# Patient Record
Sex: Female | Born: 1975 | Race: Black or African American | Hispanic: No | Marital: Single | Smoking: Never smoker
Health system: Southern US, Community
[De-identification: ages and names within clinical notes are randomized; demographics above are authoritative.]

## PROBLEM LIST (undated history)

## (undated) DIAGNOSIS — D649 Anemia, unspecified: Secondary | ICD-10-CM

## (undated) DIAGNOSIS — I10 Essential (primary) hypertension: Secondary | ICD-10-CM

## (undated) HISTORY — PX: OTHER SURGICAL HISTORY: SHX169

---

## 1997-03-02 ENCOUNTER — Encounter (HOSPITAL_COMMUNITY): Admission: RE | Admit: 1997-03-02 | Discharge: 1997-05-31 | Payer: Self-pay | Admitting: Obstetrics & Gynecology

## 1997-06-30 ENCOUNTER — Encounter (HOSPITAL_COMMUNITY): Admission: RE | Admit: 1997-06-30 | Discharge: 1997-09-28 | Payer: Self-pay | Admitting: *Deleted

## 1998-05-13 ENCOUNTER — Ambulatory Visit (HOSPITAL_COMMUNITY): Admission: RE | Admit: 1998-05-13 | Discharge: 1998-05-13 | Payer: Self-pay | Admitting: Obstetrics & Gynecology

## 1998-05-27 ENCOUNTER — Encounter: Admission: RE | Admit: 1998-05-27 | Discharge: 1998-05-27 | Payer: Self-pay | Admitting: Obstetrics

## 1998-06-09 ENCOUNTER — Ambulatory Visit (HOSPITAL_COMMUNITY): Admission: RE | Admit: 1998-06-09 | Discharge: 1998-06-09 | Payer: Self-pay | Admitting: Obstetrics & Gynecology

## 1998-07-01 ENCOUNTER — Ambulatory Visit (HOSPITAL_COMMUNITY): Admission: RE | Admit: 1998-07-01 | Discharge: 1998-07-01 | Payer: Self-pay | Admitting: Obstetrics

## 1998-07-14 ENCOUNTER — Encounter: Admission: RE | Admit: 1998-07-14 | Discharge: 1998-07-14 | Payer: Self-pay | Admitting: Obstetrics & Gynecology

## 1998-07-21 ENCOUNTER — Encounter: Admission: RE | Admit: 1998-07-21 | Discharge: 1998-07-21 | Payer: Self-pay | Admitting: Obstetrics & Gynecology

## 1998-07-28 ENCOUNTER — Encounter: Admission: RE | Admit: 1998-07-28 | Discharge: 1998-07-28 | Payer: Self-pay | Admitting: Obstetrics & Gynecology

## 1998-07-29 ENCOUNTER — Ambulatory Visit (HOSPITAL_COMMUNITY): Admission: RE | Admit: 1998-07-29 | Discharge: 1998-07-29 | Payer: Self-pay | Admitting: Obstetrics

## 1998-08-11 ENCOUNTER — Encounter: Admission: RE | Admit: 1998-08-11 | Discharge: 1998-08-11 | Payer: Self-pay | Admitting: Obstetrics & Gynecology

## 1998-08-18 ENCOUNTER — Encounter: Admission: RE | Admit: 1998-08-18 | Discharge: 1998-08-18 | Payer: Self-pay | Admitting: Obstetrics & Gynecology

## 1998-08-19 ENCOUNTER — Ambulatory Visit (HOSPITAL_COMMUNITY): Admission: RE | Admit: 1998-08-19 | Discharge: 1998-08-19 | Payer: Self-pay | Admitting: Obstetrics

## 1998-08-26 ENCOUNTER — Ambulatory Visit (HOSPITAL_COMMUNITY): Admission: RE | Admit: 1998-08-26 | Discharge: 1998-08-26 | Payer: Self-pay | Admitting: Obstetrics & Gynecology

## 1998-09-02 ENCOUNTER — Encounter: Payer: Self-pay | Admitting: Obstetrics & Gynecology

## 1998-09-02 ENCOUNTER — Inpatient Hospital Stay (HOSPITAL_COMMUNITY): Admission: AD | Admit: 1998-09-02 | Discharge: 1998-09-02 | Payer: Self-pay | Admitting: *Deleted

## 1998-09-08 ENCOUNTER — Encounter: Admission: RE | Admit: 1998-09-08 | Discharge: 1998-09-08 | Payer: Self-pay | Admitting: Obstetrics & Gynecology

## 1998-09-09 ENCOUNTER — Ambulatory Visit (HOSPITAL_COMMUNITY): Admission: RE | Admit: 1998-09-09 | Discharge: 1998-09-09 | Payer: Self-pay | Admitting: Obstetrics

## 1998-09-15 ENCOUNTER — Encounter: Admission: RE | Admit: 1998-09-15 | Discharge: 1998-09-15 | Payer: Self-pay | Admitting: Obstetrics & Gynecology

## 1998-09-22 ENCOUNTER — Encounter: Admission: RE | Admit: 1998-09-22 | Discharge: 1998-09-22 | Payer: Self-pay | Admitting: Obstetrics & Gynecology

## 1998-09-23 ENCOUNTER — Encounter (HOSPITAL_COMMUNITY): Admission: RE | Admit: 1998-09-23 | Discharge: 1998-10-04 | Payer: Self-pay | Admitting: *Deleted

## 1998-09-29 ENCOUNTER — Encounter: Admission: RE | Admit: 1998-09-29 | Discharge: 1998-09-29 | Payer: Self-pay | Admitting: Obstetrics & Gynecology

## 1998-09-30 ENCOUNTER — Encounter (INDEPENDENT_AMBULATORY_CARE_PROVIDER_SITE_OTHER): Payer: Self-pay | Admitting: Specialist

## 1998-09-30 ENCOUNTER — Inpatient Hospital Stay (HOSPITAL_COMMUNITY): Admission: AD | Admit: 1998-09-30 | Discharge: 1998-10-08 | Payer: Self-pay | Admitting: Obstetrics & Gynecology

## 1998-09-30 ENCOUNTER — Ambulatory Visit (HOSPITAL_COMMUNITY): Admission: RE | Admit: 1998-09-30 | Discharge: 1998-09-30 | Payer: Self-pay | Admitting: Obstetrics

## 1998-10-05 ENCOUNTER — Encounter (HOSPITAL_COMMUNITY): Admission: RE | Admit: 1998-10-05 | Discharge: 1999-01-03 | Payer: Self-pay | Admitting: *Deleted

## 1998-10-06 ENCOUNTER — Encounter: Admission: RE | Admit: 1998-10-06 | Discharge: 1998-10-06 | Payer: Self-pay | Admitting: Obstetrics & Gynecology

## 1998-10-28 ENCOUNTER — Other Ambulatory Visit: Admission: RE | Admit: 1998-10-28 | Discharge: 1998-10-28 | Payer: Self-pay | Admitting: *Deleted

## 1998-11-17 ENCOUNTER — Ambulatory Visit (HOSPITAL_COMMUNITY): Admission: RE | Admit: 1998-11-17 | Discharge: 1998-11-17 | Payer: Self-pay | Admitting: *Deleted

## 1998-11-18 ENCOUNTER — Encounter: Admission: RE | Admit: 1998-11-18 | Discharge: 1998-11-18 | Payer: Self-pay | Admitting: Family Medicine

## 1998-12-20 ENCOUNTER — Encounter: Admission: RE | Admit: 1998-12-20 | Discharge: 1998-12-20 | Payer: Self-pay | Admitting: Family Medicine

## 1999-01-07 ENCOUNTER — Encounter (HOSPITAL_COMMUNITY): Admission: RE | Admit: 1999-01-07 | Discharge: 1999-04-07 | Payer: Self-pay | Admitting: *Deleted

## 1999-02-02 ENCOUNTER — Ambulatory Visit (HOSPITAL_COMMUNITY): Admission: RE | Admit: 1999-02-02 | Discharge: 1999-02-02 | Payer: Self-pay | Admitting: *Deleted

## 1999-02-02 ENCOUNTER — Encounter (INDEPENDENT_AMBULATORY_CARE_PROVIDER_SITE_OTHER): Payer: Self-pay

## 1999-05-08 ENCOUNTER — Encounter: Admission: RE | Admit: 1999-05-08 | Discharge: 1999-06-07 | Payer: Self-pay | Admitting: *Deleted

## 1999-07-29 ENCOUNTER — Encounter: Admission: RE | Admit: 1999-07-29 | Discharge: 1999-07-29 | Payer: Self-pay | Admitting: Family Medicine

## 1999-10-13 ENCOUNTER — Encounter: Admission: RE | Admit: 1999-10-13 | Discharge: 1999-10-13 | Payer: Self-pay | Admitting: Family Medicine

## 1999-10-13 ENCOUNTER — Other Ambulatory Visit: Admission: RE | Admit: 1999-10-13 | Discharge: 1999-10-13 | Payer: Self-pay | Admitting: Family Medicine

## 2000-06-27 ENCOUNTER — Encounter: Admission: RE | Admit: 2000-06-27 | Discharge: 2000-06-27 | Payer: Self-pay | Admitting: Family Medicine

## 2000-11-29 ENCOUNTER — Other Ambulatory Visit: Admission: RE | Admit: 2000-11-29 | Discharge: 2000-11-29 | Payer: Self-pay | Admitting: Family Medicine

## 2000-11-29 ENCOUNTER — Encounter: Admission: RE | Admit: 2000-11-29 | Discharge: 2000-11-29 | Payer: Self-pay | Admitting: Family Medicine

## 2000-12-25 ENCOUNTER — Encounter: Admission: RE | Admit: 2000-12-25 | Discharge: 2000-12-25 | Payer: Self-pay | Admitting: Family Medicine

## 2001-02-15 ENCOUNTER — Encounter: Admission: RE | Admit: 2001-02-15 | Discharge: 2001-02-15 | Payer: Self-pay | Admitting: Family Medicine

## 2001-08-29 ENCOUNTER — Encounter: Admission: RE | Admit: 2001-08-29 | Discharge: 2001-08-29 | Payer: Self-pay | Admitting: Family Medicine

## 2001-11-29 ENCOUNTER — Encounter: Admission: RE | Admit: 2001-11-29 | Discharge: 2001-11-29 | Payer: Self-pay | Admitting: Family Medicine

## 2001-11-29 ENCOUNTER — Other Ambulatory Visit: Admission: RE | Admit: 2001-11-29 | Discharge: 2001-11-29 | Payer: Self-pay | Admitting: Emergency Medicine

## 2002-01-05 ENCOUNTER — Emergency Department (HOSPITAL_COMMUNITY): Admission: EM | Admit: 2002-01-05 | Discharge: 2002-01-05 | Payer: Self-pay | Admitting: Internal Medicine

## 2002-03-26 ENCOUNTER — Encounter: Admission: RE | Admit: 2002-03-26 | Discharge: 2002-03-26 | Payer: Self-pay | Admitting: Family Medicine

## 2002-04-08 ENCOUNTER — Encounter: Admission: RE | Admit: 2002-04-08 | Discharge: 2002-04-08 | Payer: Self-pay | Admitting: Sports Medicine

## 2002-04-09 ENCOUNTER — Ambulatory Visit (HOSPITAL_COMMUNITY): Admission: RE | Admit: 2002-04-09 | Discharge: 2002-04-09 | Payer: Self-pay | Admitting: Family Medicine

## 2002-04-30 ENCOUNTER — Encounter: Admission: RE | Admit: 2002-04-30 | Discharge: 2002-04-30 | Payer: Self-pay | Admitting: Family Medicine

## 2002-05-07 ENCOUNTER — Encounter: Admission: RE | Admit: 2002-05-07 | Discharge: 2002-05-07 | Payer: Self-pay | Admitting: Family Medicine

## 2002-05-28 ENCOUNTER — Encounter: Admission: RE | Admit: 2002-05-28 | Discharge: 2002-05-28 | Payer: Self-pay | Admitting: Family Medicine

## 2002-06-17 ENCOUNTER — Encounter: Admission: RE | Admit: 2002-06-17 | Discharge: 2002-06-17 | Payer: Self-pay | Admitting: Family Medicine

## 2002-07-02 ENCOUNTER — Ambulatory Visit (HOSPITAL_COMMUNITY): Admission: RE | Admit: 2002-07-02 | Discharge: 2002-07-02 | Payer: Self-pay | Admitting: Family Medicine

## 2002-07-02 ENCOUNTER — Encounter: Payer: Self-pay | Admitting: Family Medicine

## 2002-07-15 ENCOUNTER — Inpatient Hospital Stay (HOSPITAL_COMMUNITY): Admission: RE | Admit: 2002-07-15 | Discharge: 2002-07-15 | Payer: Self-pay | Admitting: *Deleted

## 2002-07-18 ENCOUNTER — Encounter (INDEPENDENT_AMBULATORY_CARE_PROVIDER_SITE_OTHER): Payer: Self-pay | Admitting: Specialist

## 2002-07-18 ENCOUNTER — Ambulatory Visit (HOSPITAL_COMMUNITY): Admission: RE | Admit: 2002-07-18 | Discharge: 2002-07-18 | Payer: Self-pay | Admitting: Obstetrics and Gynecology

## 2002-07-21 ENCOUNTER — Encounter: Admission: RE | Admit: 2002-07-21 | Discharge: 2002-07-21 | Payer: Self-pay | Admitting: Family Medicine

## 2002-09-05 ENCOUNTER — Encounter: Admission: RE | Admit: 2002-09-05 | Discharge: 2002-09-05 | Payer: Self-pay | Admitting: Family Medicine

## 2002-10-31 ENCOUNTER — Encounter (INDEPENDENT_AMBULATORY_CARE_PROVIDER_SITE_OTHER): Payer: Self-pay | Admitting: *Deleted

## 2002-10-31 LAB — CONVERTED CEMR LAB

## 2002-11-01 ENCOUNTER — Emergency Department (HOSPITAL_COMMUNITY): Admission: EM | Admit: 2002-11-01 | Discharge: 2002-11-01 | Payer: Self-pay | Admitting: Emergency Medicine

## 2002-11-07 ENCOUNTER — Other Ambulatory Visit: Admission: RE | Admit: 2002-11-07 | Discharge: 2002-11-07 | Payer: Self-pay | Admitting: Family Medicine

## 2002-11-07 ENCOUNTER — Encounter: Admission: RE | Admit: 2002-11-07 | Discharge: 2002-11-07 | Payer: Self-pay | Admitting: Sports Medicine

## 2003-09-30 ENCOUNTER — Other Ambulatory Visit: Admission: RE | Admit: 2003-09-30 | Discharge: 2003-09-30 | Payer: Self-pay | Admitting: Obstetrics and Gynecology

## 2003-10-07 ENCOUNTER — Ambulatory Visit (HOSPITAL_COMMUNITY): Admission: RE | Admit: 2003-10-07 | Discharge: 2003-10-07 | Payer: Self-pay | Admitting: Obstetrics and Gynecology

## 2003-10-11 ENCOUNTER — Emergency Department (HOSPITAL_COMMUNITY): Admission: EM | Admit: 2003-10-11 | Discharge: 2003-10-11 | Payer: Self-pay | Admitting: Emergency Medicine

## 2003-10-15 ENCOUNTER — Emergency Department (HOSPITAL_COMMUNITY): Admission: EM | Admit: 2003-10-15 | Discharge: 2003-10-15 | Payer: Self-pay | Admitting: Emergency Medicine

## 2004-02-19 ENCOUNTER — Inpatient Hospital Stay (HOSPITAL_COMMUNITY): Admission: AD | Admit: 2004-02-19 | Discharge: 2004-02-22 | Payer: Self-pay | Admitting: *Deleted

## 2005-06-06 ENCOUNTER — Inpatient Hospital Stay (HOSPITAL_COMMUNITY): Admission: AD | Admit: 2005-06-06 | Discharge: 2005-06-07 | Payer: Self-pay | Admitting: Obstetrics & Gynecology

## 2005-08-20 ENCOUNTER — Inpatient Hospital Stay (HOSPITAL_COMMUNITY): Admission: AD | Admit: 2005-08-20 | Discharge: 2005-08-26 | Payer: Self-pay | Admitting: Obstetrics and Gynecology

## 2005-09-13 ENCOUNTER — Emergency Department (HOSPITAL_COMMUNITY): Admission: EM | Admit: 2005-09-13 | Discharge: 2005-09-13 | Payer: Self-pay | Admitting: *Deleted

## 2005-10-05 ENCOUNTER — Ambulatory Visit: Payer: Self-pay | Admitting: Oncology

## 2005-10-05 LAB — CBC & DIFF AND RETIC
BASO%: 0.6 % (ref 0.0–2.0)
Basophils Absolute: 0 10*3/uL (ref 0.0–0.1)
EOS%: 1.8 % (ref 0.0–7.0)
Eosinophils Absolute: 0.1 10*3/uL (ref 0.0–0.5)
HCT: 36.1 % (ref 34.8–46.6)
HGB: 11.6 g/dL (ref 11.6–15.9)
IRF: 0.28 (ref 0.130–0.330)
LYMPH%: 30.6 % (ref 14.0–48.0)
MCH: 24.8 pg — ABNORMAL LOW (ref 26.0–34.0)
MCHC: 32.2 g/dL (ref 32.0–36.0)
MCV: 76.9 fL — ABNORMAL LOW (ref 81.0–101.0)
MONO#: 0.7 10*3/uL (ref 0.1–0.9)
MONO%: 10.1 % (ref 0.0–13.0)
NEUT#: 3.8 10*3/uL (ref 1.5–6.5)
NEUT%: 56.9 % (ref 39.6–76.8)
Platelets: 114 10*3/uL — ABNORMAL LOW (ref 145–400)
RBC: 4.69 10*6/uL (ref 3.70–5.32)
RDW: 15.1 % — ABNORMAL HIGH (ref 11.3–14.5)
RETIC #: 37.1 10*3/uL (ref 19.7–115.1)
Retic %: 0.8 % (ref 0.4–2.3)
WBC: 6.7 10*3/uL (ref 3.9–10.0)
lymph#: 2.1 10*3/uL (ref 0.9–3.3)

## 2005-10-05 LAB — COMPREHENSIVE METABOLIC PANEL
ALT: 18 U/L (ref 0–40)
AST: 23 U/L (ref 0–37)
Albumin: 4.7 g/dL (ref 3.5–5.2)
Alkaline Phosphatase: 111 U/L (ref 39–117)
BUN: 14 mg/dL (ref 6–23)
CO2: 26 mEq/L (ref 19–32)
Calcium: 9.7 mg/dL (ref 8.4–10.5)
Chloride: 105 mEq/L (ref 96–112)
Creatinine, Ser: 0.88 mg/dL (ref 0.40–1.20)
Glucose, Bld: 94 mg/dL (ref 70–99)
Potassium: 4 mEq/L (ref 3.5–5.3)
Sodium: 143 mEq/L (ref 135–145)
Total Bilirubin: 0.4 mg/dL (ref 0.3–1.2)
Total Protein: 7.9 g/dL (ref 6.0–8.3)

## 2005-10-05 LAB — IRON AND TIBC
%SAT: 16 % — ABNORMAL LOW (ref 20–55)
Iron: 57 ug/dL (ref 42–145)
TIBC: 366 ug/dL (ref 250–470)
UIBC: 309 ug/dL

## 2005-10-05 LAB — CHCC SMEAR

## 2005-10-05 LAB — LACTATE DEHYDROGENASE: LDH: 194 U/L (ref 94–250)

## 2005-10-05 LAB — FERRITIN: Ferritin: 6 ng/mL — ABNORMAL LOW (ref 10–291)

## 2006-01-03 ENCOUNTER — Ambulatory Visit: Payer: Self-pay | Admitting: Oncology

## 2006-03-29 DIAGNOSIS — J45990 Exercise induced bronchospasm: Secondary | ICD-10-CM

## 2006-03-29 DIAGNOSIS — I10 Essential (primary) hypertension: Secondary | ICD-10-CM

## 2006-03-29 DIAGNOSIS — L2089 Other atopic dermatitis: Secondary | ICD-10-CM

## 2006-03-29 DIAGNOSIS — D696 Thrombocytopenia, unspecified: Secondary | ICD-10-CM

## 2006-03-30 ENCOUNTER — Encounter (INDEPENDENT_AMBULATORY_CARE_PROVIDER_SITE_OTHER): Payer: Self-pay | Admitting: *Deleted

## 2006-04-02 ENCOUNTER — Ambulatory Visit: Payer: Self-pay | Admitting: Oncology

## 2006-04-05 ENCOUNTER — Encounter (INDEPENDENT_AMBULATORY_CARE_PROVIDER_SITE_OTHER): Payer: Self-pay | Admitting: Family Medicine

## 2006-04-05 LAB — CBC WITH DIFFERENTIAL/PLATELET
BASO%: 0.4 % (ref 0.0–2.0)
Basophils Absolute: 0 10*3/uL (ref 0.0–0.1)
EOS%: 1.3 % (ref 0.0–7.0)
Eosinophils Absolute: 0.1 10*3/uL (ref 0.0–0.5)
HCT: 33.5 % — ABNORMAL LOW (ref 34.8–46.6)
HGB: 11.2 g/dL — ABNORMAL LOW (ref 11.6–15.9)
LYMPH%: 32.8 % (ref 14.0–48.0)
MCH: 25.8 pg — ABNORMAL LOW (ref 26.0–34.0)
MCHC: 33.5 g/dL (ref 32.0–36.0)
MCV: 77.1 fL — ABNORMAL LOW (ref 81.0–101.0)
MONO#: 0.6 10*3/uL (ref 0.1–0.9)
MONO%: 12 % (ref 0.0–13.0)
NEUT#: 2.6 10*3/uL (ref 1.5–6.5)
NEUT%: 53.5 % (ref 39.6–76.8)
Platelets: 67 10*3/uL — ABNORMAL LOW (ref 145–400)
RBC: 4.34 10*6/uL (ref 3.70–5.32)
RDW: 13.7 % (ref 11.3–14.5)
WBC: 4.9 10*3/uL (ref 3.9–10.0)
lymph#: 1.6 10*3/uL (ref 0.9–3.3)

## 2006-04-05 LAB — IRON AND TIBC
%SAT: 5 % — ABNORMAL LOW (ref 20–55)
Iron: 17 ug/dL — ABNORMAL LOW (ref 42–145)
UIBC: 318 ug/dL

## 2006-04-05 LAB — FERRITIN: Ferritin: 7 ng/mL — ABNORMAL LOW (ref 10–291)

## 2006-05-28 ENCOUNTER — Ambulatory Visit: Payer: Self-pay | Admitting: Oncology

## 2006-07-23 ENCOUNTER — Ambulatory Visit: Payer: Self-pay | Admitting: Oncology

## 2006-09-16 ENCOUNTER — Ambulatory Visit: Payer: Self-pay | Admitting: Oncology

## 2006-12-05 ENCOUNTER — Ambulatory Visit: Payer: Self-pay | Admitting: Oncology

## 2006-12-07 ENCOUNTER — Encounter (INDEPENDENT_AMBULATORY_CARE_PROVIDER_SITE_OTHER): Payer: Self-pay | Admitting: Family Medicine

## 2006-12-07 LAB — CBC WITH DIFFERENTIAL/PLATELET
BASO%: 0.6 % (ref 0.0–2.0)
EOS%: 2.5 % (ref 0.0–7.0)
MCH: 29.1 pg (ref 26.0–34.0)
MCHC: 34.7 g/dL (ref 32.0–36.0)
MONO#: 0.8 10*3/uL (ref 0.1–0.9)
RBC: 3.69 10*6/uL — ABNORMAL LOW (ref 3.70–5.32)
RDW: 12.5 % (ref 11.3–14.5)
WBC: 7.6 10*3/uL (ref 3.9–10.0)
lymph#: 1.8 10*3/uL (ref 0.9–3.3)

## 2006-12-07 LAB — COMPREHENSIVE METABOLIC PANEL
ALT: 18 U/L (ref 0–35)
AST: 20 U/L (ref 0–37)
Albumin: 3.3 g/dL — ABNORMAL LOW (ref 3.5–5.2)
CO2: 21 mEq/L (ref 19–32)
Calcium: 8.9 mg/dL (ref 8.4–10.5)
Chloride: 104 mEq/L (ref 96–112)
Creatinine, Ser: 0.62 mg/dL (ref 0.40–1.20)
Potassium: 4 mEq/L (ref 3.5–5.3)
Sodium: 137 mEq/L (ref 135–145)
Total Protein: 6.9 g/dL (ref 6.0–8.3)

## 2006-12-07 LAB — IRON AND TIBC: TIBC: 394 ug/dL (ref 250–470)

## 2006-12-07 LAB — FERRITIN: Ferritin: 9 ng/mL — ABNORMAL LOW (ref 10–291)

## 2007-01-21 ENCOUNTER — Ambulatory Visit: Payer: Self-pay | Admitting: Oncology

## 2007-01-23 LAB — CBC WITH DIFFERENTIAL/PLATELET
EOS%: 1.1 % (ref 0.0–7.0)
Eosinophils Absolute: 0.1 10*3/uL (ref 0.0–0.5)
MCH: 28.4 pg (ref 26.0–34.0)
MCV: 82.9 fL (ref 81.0–101.0)
MONO%: 9.6 % (ref 0.0–13.0)
NEUT#: 4.6 10*3/uL (ref 1.5–6.5)
RBC: 4.06 10*6/uL (ref 3.70–5.32)
RDW: 13.1 % (ref 11.3–14.5)

## 2007-01-23 LAB — COMPREHENSIVE METABOLIC PANEL
AST: 23 U/L (ref 0–37)
Albumin: 3.5 g/dL (ref 3.5–5.2)
Alkaline Phosphatase: 169 U/L — ABNORMAL HIGH (ref 39–117)
Potassium: 3.8 mEq/L (ref 3.5–5.3)
Sodium: 138 mEq/L (ref 135–145)
Total Protein: 7 g/dL (ref 6.0–8.3)

## 2007-02-04 ENCOUNTER — Observation Stay (HOSPITAL_COMMUNITY): Admission: RE | Admit: 2007-02-04 | Discharge: 2007-02-04 | Payer: Self-pay | Admitting: Obstetrics & Gynecology

## 2007-02-07 ENCOUNTER — Inpatient Hospital Stay (HOSPITAL_COMMUNITY): Admission: AD | Admit: 2007-02-07 | Discharge: 2007-02-10 | Payer: Self-pay | Admitting: Obstetrics and Gynecology

## 2007-02-07 ENCOUNTER — Encounter (INDEPENDENT_AMBULATORY_CARE_PROVIDER_SITE_OTHER): Payer: Self-pay | Admitting: Obstetrics and Gynecology

## 2007-03-08 ENCOUNTER — Ambulatory Visit: Payer: Self-pay | Admitting: Oncology

## 2007-04-17 ENCOUNTER — Ambulatory Visit: Payer: Self-pay | Admitting: Oncology

## 2007-04-19 LAB — IRON AND TIBC
%SAT: 16 % — ABNORMAL LOW (ref 20–55)
Iron: 57 ug/dL (ref 42–145)
TIBC: 365 ug/dL (ref 250–470)
UIBC: 308 ug/dL

## 2007-04-19 LAB — CBC WITH DIFFERENTIAL/PLATELET
BASO%: 0.3 % (ref 0.0–2.0)
Basophils Absolute: 0 10*3/uL (ref 0.0–0.1)
EOS%: 2 % (ref 0.0–7.0)
Eosinophils Absolute: 0.1 10*3/uL (ref 0.0–0.5)
HCT: 34.8 % (ref 34.8–46.6)
HGB: 11.7 g/dL (ref 11.6–15.9)
LYMPH%: 31.6 % (ref 14.0–48.0)
MCH: 26 pg (ref 26.0–34.0)
MCHC: 33.7 g/dL (ref 32.0–36.0)
MCV: 77.1 fL — ABNORMAL LOW (ref 81.0–101.0)
MONO#: 0.6 10*3/uL (ref 0.1–0.9)
MONO%: 11.4 % (ref 0.0–13.0)
NEUT#: 3 10*3/uL (ref 1.5–6.5)
NEUT%: 54.7 % (ref 39.6–76.8)
Platelets: 95 10*3/uL — ABNORMAL LOW (ref 145–400)
RBC: 4.51 10*6/uL (ref 3.70–5.32)
RDW: 14.5 % (ref 11.3–14.5)
WBC: 5.5 10*3/uL (ref 3.9–10.0)
lymph#: 1.7 10*3/uL (ref 0.9–3.3)

## 2007-04-19 LAB — COMPREHENSIVE METABOLIC PANEL
ALT: 36 U/L — ABNORMAL HIGH (ref 0–35)
AST: 36 U/L (ref 0–37)
Albumin: 4.6 g/dL (ref 3.5–5.2)
Alkaline Phosphatase: 104 U/L (ref 39–117)
BUN: 11 mg/dL (ref 6–23)
CO2: 27 mEq/L (ref 19–32)
Calcium: 9.7 mg/dL (ref 8.4–10.5)
Chloride: 104 mEq/L (ref 96–112)
Creatinine, Ser: 0.83 mg/dL (ref 0.40–1.20)
Glucose, Bld: 84 mg/dL (ref 70–99)
Potassium: 4.1 mEq/L (ref 3.5–5.3)
Sodium: 140 mEq/L (ref 135–145)
Total Bilirubin: 0.4 mg/dL (ref 0.3–1.2)
Total Protein: 7.9 g/dL (ref 6.0–8.3)

## 2007-04-19 LAB — LACTATE DEHYDROGENASE: LDH: 242 U/L (ref 94–250)

## 2007-04-19 LAB — FERRITIN: Ferritin: 8 ng/mL — ABNORMAL LOW (ref 10–291)

## 2007-04-22 LAB — RHEUMATOID FACTOR: Rheumatoid fact SerPl-aCnc: <20 IU/mL (ref 0–20)

## 2007-04-22 LAB — ANA: Anti Nuclear Antibody(ANA): NEGATIVE

## 2007-08-20 ENCOUNTER — Emergency Department (HOSPITAL_COMMUNITY): Admission: EM | Admit: 2007-08-20 | Discharge: 2007-08-20 | Payer: Self-pay | Admitting: Emergency Medicine

## 2007-09-07 ENCOUNTER — Emergency Department (HOSPITAL_COMMUNITY): Admission: EM | Admit: 2007-09-07 | Discharge: 2007-09-07 | Payer: Self-pay | Admitting: Emergency Medicine

## 2007-09-18 ENCOUNTER — Emergency Department (HOSPITAL_COMMUNITY): Admission: EM | Admit: 2007-09-18 | Discharge: 2007-09-18 | Payer: Self-pay | Admitting: Family Medicine

## 2007-10-23 ENCOUNTER — Ambulatory Visit: Payer: Self-pay | Admitting: Oncology

## 2008-02-13 ENCOUNTER — Emergency Department (HOSPITAL_COMMUNITY): Admission: EM | Admit: 2008-02-13 | Discharge: 2008-02-13 | Payer: Self-pay | Admitting: Emergency Medicine

## 2008-02-15 ENCOUNTER — Emergency Department (HOSPITAL_COMMUNITY): Admission: EM | Admit: 2008-02-15 | Discharge: 2008-02-15 | Payer: Self-pay | Admitting: Emergency Medicine

## 2008-11-27 ENCOUNTER — Ambulatory Visit: Payer: Self-pay | Admitting: Oncology

## 2009-11-03 ENCOUNTER — Emergency Department (HOSPITAL_COMMUNITY): Admission: EM | Admit: 2009-11-03 | Discharge: 2009-11-04 | Payer: Self-pay | Admitting: Emergency Medicine

## 2010-02-22 ENCOUNTER — Emergency Department (HOSPITAL_COMMUNITY)
Admission: EM | Admit: 2010-02-22 | Discharge: 2010-02-22 | Payer: Self-pay | Source: Home / Self Care | Admitting: Emergency Medicine

## 2010-05-29 ENCOUNTER — Emergency Department (HOSPITAL_COMMUNITY)
Admission: EM | Admit: 2010-05-29 | Discharge: 2010-05-29 | Disposition: A | Discharge status: Home / Self Care | Payer: Self-pay | Attending: Emergency Medicine | Admitting: Emergency Medicine

## 2010-05-29 DIAGNOSIS — J45909 Unspecified asthma, uncomplicated: Secondary | ICD-10-CM | POA: Insufficient documentation

## 2010-05-29 DIAGNOSIS — J020 Streptococcal pharyngitis: Secondary | ICD-10-CM | POA: Insufficient documentation

## 2010-05-29 DIAGNOSIS — R6883 Chills (without fever): Secondary | ICD-10-CM | POA: Insufficient documentation

## 2010-05-29 DIAGNOSIS — I10 Essential (primary) hypertension: Secondary | ICD-10-CM | POA: Insufficient documentation

## 2010-05-29 DIAGNOSIS — H9209 Otalgia, unspecified ear: Secondary | ICD-10-CM | POA: Insufficient documentation

## 2010-05-29 LAB — RAPID STREP SCREEN (MED CTR MEBANE ONLY): Streptococcus, Group A Screen (Direct): POSITIVE — AB

## 2010-06-14 NOTE — Discharge Summary (Signed)
Brenda Mcintyre, Brenda Mcintyre          ACCOUNT NO.:  [id number]   MEDICAL RECORD NO.:  [id number]          PATIENT TYPE:  INP   LOCATION:  9138                          FACILITY:  [facility]   PHYSICIAN:  Carrington Clamp, M.D. DATE OF BIRTH:  [birth year]   DATE OF ADMISSION:  02/07/2007  DATE OF DISCHARGE:  02/10/2007                               DISCHARGE SUMMARY   FINAL DIAGNOSES:  1. Intrauterine pregnancy at term.  2. History of prior cesarean sections x2.  3. Breech presentation.  4. Spontaneous rupture of membranes with the active labor.  5. Idiopathic thrombocytopenic purpura  6. Preeclampsia.  7. Patient desires permanent sterilization.   PROCEDURE:  Repeat low transverse cesarean section and bilateral tubal  ligation using the Pomeroy procedure.  Delivery performed by Dr. Malva Limes.  Anesthesia:  General. Complications:  None.   This 35 year old, G7, P4-1-1-5, presents at 57 weeks' gestation with  spontaneous rupture of membranes in active labor.  The patient has been  known to have a breech presentation.  She had an attempt at version  without success and also has a history of two cesarean sections and  three VBACs as well.  The patient's antepartum course up to this point  had been complicated by ITP. Platelets upon admission were 74,000.  Otherwise, the patient's antepartum course had also been complicated by  LATEX sensitivity, history of PIH.   The patient was taken to the operating room on February 07, 2007, by Dr.  Malva Limes where a repeat low transverse cesarean section was  performed with the delivery of a 6 pound 14 ounce female infant with  Apgars of 9 and 9 and a breech presentation. After delivery, of  postpartum tubal ligation was performed by the patient request.   The patient's postoperative course was complicated by some elevated  blood pressures. The patient's platelets were stable, and she was not  having any symptoms of preeclampsia at this time in  her early  postoperative course. Low-dose labetalol was started. PIH labs were  obtained which did show some elevation in her liver function.  The  patient was on magnesium sulfate for prophylaxis.  The patient's  platelets were also down to 58,000 at this point, and she continued to  be monitored closely.   By postoperative day #2, blood pressures were still fluctuating. The  patient was diuresing well, and she was able to be weaned off magnesium  sulfate and taken to the regular mother baby unit.   Postoperative day #3, the patient's platelets were increasing and,  therefore, stable.  They were up to 75,000 at this point. Her blood  pressure was stable on labetalol.  She was felt ready for discharge.  She was sent home on a regular diet, told to decrease activities, told  to continue her vitamins and her labetalol 100 mg twice daily, was given  Percocet one to two every 4-6 hours as needed for the pain, was to  follow up in our office in 1 week for blood pressure check and, of  course, to call with any increased bleeding, pain problems or symptoms  that were discussed.  Instructions and precautions were reviewed with the  patient.   LABORATORY DATA ON DISCHARGE:  She had a hemoglobin of 9.4, white blood  cell count of 9.9, platelets of 75,000 which were up from a low of 58,  0000.  Normal PIH panel.      [name], P.A.-C.      Carrington Clamp, M.D.  Electronically Signed    MB/MEDQ  D:  03/05/2007  T:  03/05/2007  Job:  [id number]

## 2010-06-14 NOTE — Op Note (Signed)
Brenda Mcintyre, Brenda Mcintyre          ACCOUNT NO.:  [id number]   MEDICAL RECORD NO.:  [id number]          PATIENT TYPE:  INP   LOCATION:  9199                          [facility]ACILITY:  [facility]   PHYSICIAN:  Malva Limes, M.D.    DATE OF BIRTH:  1975-10-29   DATE OF PROCEDURE:  02/07/2007  DATE OF DISCHARGE:                               OPERATIVE REPORT   PREOPERATIVE DIAGNOSES:  1. Intrauterine pregnancy at term.  2. History of prior cesarean section x2.  3. Breech presentation.  4. Spontaneous rupture of membranes with active labor.  5. The patient desires permanent sterilization.  6. Idiopathic thrombocytopenic purpura.   POSTOPERATIVE DIAGNOSES:  1. Intrauterine pregnancy at term.  2. History of prior cesarean section x2.  3. Breech presentation.  4. Spontaneous rupture of membranes with active labor.  5. The patient denies permanent sterilization.  6. Idiopathic thrombocytopenic purpura.   PROCEDURE:  1. Repeat low transverse cesarean section.  2. Bilateral tubal ligation, Pomeroy procedure.   ANESTHESIA:  General.   ANTIBIOTICS:  Ancef 1 g.   DRAINS:  Foley bedside drainage.   ESTIMATED BLOOD LOSS:  Was 900 mL.   COMPLICATIONS:  None.   SPECIMENS:  Portion of right and left fallopian tubes sent to pathology.   PROCEDURE:  The patient was taken to the operating room where general  anesthetic was administered without complications.  The patient had been  previously prepped and draped.  A Pfannenstiel incision was made through  the previous scar.  This was carried down to the fascia.  The fascia was  entered in the midline, extended laterally with the Mayo scissors.  The  rectus muscles were then separated from the fascia with the Bovie.  The  rectus muscles were divided in the midline and taken superiorly and  inferiorly.  The parietal peritoneum was entered and taken superiorly  and inferiorly.  The bladder flap was taken down with sharp dissection.  The patient had a low  transverse uterine incision made in the midline  with Metzenbaum scissors.  Amniotic fluid was noted to be clear.  The  infant was delivered in the breech presentation.  On delivery of the  head, the oropharynx and nostrils were bulb suctioned.  There was a  nuchal cord x1 which was reduced.  The cord was doubly clamped and cut  and the infant was handed to the waiting NICU team.  The placenta was  then manually removed.  The uterus was exteriorized.  The uterine cavity  was cleaned and inspected.  The incision was closed using a single layer  of 0 Monocryl suture in a running, locking fashion.  The bladder flap  was not repaired.  The patient had omental adhesions to the fundus which  was taken down with the Bovie.   At this point, the right fallopian tube was grasped in the isthmic  portion.  A 2 cm knuckle was doubly ligated with 0 gut suture.  The  knuckle was excised.  Both ostia were visualized.  Hemostasis appeared  to be adequate.  The left fallopian tube had multiple adhesions to the  left ovary and  these were taken down with the Bovie.  Once the tube was  freed, a similar procedure was performed for ligation on that side.   At this point, the uterus was placed back in the abdominal cavity.  Hemostasis was rechecked and felt to be adequate.  The parietal  peritoneum and rectus muscles were reapproximated in the midline using 2-  0 Monocryl in a running fashion.  The fascia was closed using 0 Monocryl  suture in a running fashion.  The subcutaneous tissue was made  hemostatic with the Bovie.  Stainless steel clips were used to close the  skin.   The patient tolerated the procedure well. She was taken to the recovery  room after extubation.           ______________________________  Malva Limes, M.D.     MA/MEDQ  D:  02/07/2007  T:  02/07/2007  Job:  [id number]

## 2010-06-17 NOTE — Discharge Summary (Signed)
NAME:  Brenda, Mcintyre NO.:  [id number]   MEDICAL RECORD NO.:  [id number]          PATIENT TYPE:  INP   LOCATION:  9101                          [facility]ACILITY:  [facility]   PHYSICIAN:  Malva Limes, M.D.    DATE OF BIRTH:  1975-05-18   DATE OF ADMISSION:  08/20/2005  DATE OF DISCHARGE:  08/26/2005                                 DISCHARGE SUMMARY   FINAL DIAGNOSIS:  Intrauterine pregnancy at [redacted] weeks gestation, ITP, latex  allergy, vaginal birth after cesarean, pregnancy induced hypertension.   PROCEDURE:  Vaginal birth after cesarean section performed by Dr. Tretha Sciara.   COMPLICATIONS:  None.   This 35 year old G6, P 4-1-1-5 presents at [redacted] weeks gestation in active  labor.  The patient's antepartum course up to this point was complicated by  her history of ITP, her platelets had been watched throughout the pregnancy.  They were stable.  As of January they were stable.  Her platelets on her 20-  week visit were about 69,000, that was stable for the patient.  She had a  negative group B strep culture.  The patient had a history of cesarean  sections x2 and VBAC as well.  She decided she would like VBAC with this  pregnancy as well.  She had a latex sensitivity, a history of PIH, and  history of positive group B strep even though this was negative at this  time.  When she was admitted her blood pressures were elevated and her  platelets were about 70,000.  On July 22 the patient had spontaneous vaginal  delivery of a 7 pound 4 ounce female infant without Apgars of 9 and 9.  Delivery went without complications.  There was a nuchal cord x1. Blood  pressures remained elevated, PIH labs were obtained.  The patient's PIH labs  were within normal limits.  Her platelets did decrease to 31,000.  Otherwise  she was stable.  No preeclampsia was suspected.  Blood pressures during her  postpartum course were returning to normal.  Labs were stable and her  platelets were improving on  her hospital postpartum course.  The patient was  kept in-house.  On postpartum day #3 she did have some elevated blood  pressures and 2+ patellar reflexes bilaterally.  She was started on  labetalol and kept in-house to postpartum day #4.  The patient was feeling  much better.  She still had some elevated blood pressures with a slightly  elevated uric acid.  She was kept on her labetalol and was felt ready for  discharge on the 28th.  The patient was sent home.  The patient's uric acid  on the 27th was still about 7.3.  She was sent home on the 28th, told to  decrease activities, told to continue her labetalol 400 mg twice daily, was  to follow up in our office in 5 days.  Of course to call with any increased  bleeding, pain or problems.  She also had a follow-up appointment with Dr.  Arline Asp at the cancer center to follow up with her ITP and to recheck her  blood work.  LABS ON DISCHARGE:  The patient had a hemoglobin of 9.3, white blood cell  count of 9.5, platelets were back up to 103,000 with a low of 31,000 on the  23rd and PIH panel was within normal limits except for slightly elevated  uric acid of 7.3.     [name], P.A.-C.    ______________________________  Malva Limes, M.D.   MB/MEDQ  D:  10/05/2005  T:  10/05/2005  Job:  [id number]

## 2010-06-17 NOTE — Op Note (Signed)
   NAME:  Brenda Mcintyre, Brenda Mcintyre                    ACCOUNT NO.:  [id number]   MEDICAL RECORD NO.:  [id number]                   PATIENT TYPE:  AMB   LOCATION:  SDC                                  FACILITY:  [facility]   PHYSICIAN:  Malva Limes, M.D.                 DATE OF BIRTH:  14-Jan-1976   DATE OF PROCEDURE:  07/18/2002  DATE OF DISCHARGE:                                 OPERATIVE REPORT   PREOPERATIVE DIAGNOSES:  1. Intrauterine pregnancy at 34 weeks' estimated gestational age.  2. Fetal demise.  3. Thrombocytopenia.   POSTOPERATIVE DIAGNOSES:  1. Intrauterine pregnancy at 33 weeks' estimated gestational age.  2. Fetal demise.  3. Thrombocytopenia.   PROCEDURE:  Dilation and evacuation.   SURGEON:  Malva Limes, M.D.   ANESTHESIA:  General.   ANTIBIOTICS:  Ancef 1 g.   ESTIMATED BLOOD LOSS:  30 mL.   COMPLICATIONS:  None.   SPECIMENS:  Products of conception sent to pathology.   PROCEDURE:  The patient was taken to the operating room, where a general  anesthetic was administered without complications.  She was then placed in  the dorsal lithotomy position and prepped with Betadine.  Her bladder was  drained with a non-latex catheter.  The patient was then draped in the usual  fashion for this procedure.  The laminaria which had previously been placed  was removed.  The cervix was grasped with a single-tooth tenaculum.  The  cervical os was serially dilated to a 32 [place].  The 16 mm suction cannula  was easily passed into the uterine cavity.  The amniotic sac was ruptured  and turbid fluid noted to be obtained.  The suction catheter was then  removed and the ovum forceps were used to remove products of conception.  It  was obvious that the fetus had been dead for several days.  Once all the  fetal tissue was removed, sharp curettage was performed, followed by repeat  suction.  The patient tolerated the procedure well.  She was given  Methergine and Pitocin in the  operating room.  The patient  was transferred to the recovery room in stable condition.  She will be given  Keflex 500 mg one p.o. q.i.d. for two days, Vicodin to take p.r.n., and  Methergine 0.2 mg to take for a total of six doses.  The patient will follow  up in Dr. Gavin Potters' office in one week to recheck her platelet count.                                               Malva Limes, M.D.    MA/MEDQ  D:  07/18/2002  T:  07/18/2002  Job:  [id number]

## 2010-08-15 ENCOUNTER — Emergency Department (HOSPITAL_COMMUNITY)
Admission: EM | Admit: 2010-08-15 | Discharge: 2010-08-15 | Disposition: A | Discharge status: Home / Self Care | Payer: Self-pay | Attending: Emergency Medicine | Admitting: Emergency Medicine

## 2010-08-15 DIAGNOSIS — H9209 Otalgia, unspecified ear: Secondary | ICD-10-CM | POA: Insufficient documentation

## 2010-08-15 DIAGNOSIS — J45909 Unspecified asthma, uncomplicated: Secondary | ICD-10-CM | POA: Insufficient documentation

## 2010-08-15 DIAGNOSIS — K029 Dental caries, unspecified: Secondary | ICD-10-CM | POA: Insufficient documentation

## 2010-08-15 DIAGNOSIS — I10 Essential (primary) hypertension: Secondary | ICD-10-CM | POA: Insufficient documentation

## 2010-08-15 DIAGNOSIS — K089 Disorder of teeth and supporting structures, unspecified: Secondary | ICD-10-CM | POA: Insufficient documentation

## 2010-10-20 LAB — URINE MICROSCOPIC-ADD ON

## 2010-10-20 LAB — DIFFERENTIAL
Basophils Absolute: 0
Basophils Absolute: 0
Eosinophils Absolute: 0
Eosinophils Relative: 0
Lymphocytes Relative: 15
Lymphs Abs: 1.9
Monocytes Absolute: 0.6
Neutro Abs: 9.6 — ABNORMAL HIGH

## 2010-10-20 LAB — URINALYSIS, ROUTINE W REFLEX MICROSCOPIC
Bilirubin Urine: NEGATIVE
Ketones, ur: NEGATIVE
Specific Gravity, Urine: 1.02
Urobilinogen, UA: 0.2

## 2010-10-20 LAB — CBC
HCT: 32.3 — ABNORMAL LOW
HCT: 33.7 — ABNORMAL LOW
Hemoglobin: 11.4 — ABNORMAL LOW
MCHC: 34.3
MCHC: 34.6
MCV: 81.4
MCV: 82.2
MCV: 83.1
Platelets: 65 — ABNORMAL LOW
Platelets: 74 — ABNORMAL LOW
Platelets: 75 — ABNORMAL LOW
RBC: 3.73 — ABNORMAL LOW
RBC: 4.05
RBC: 4.11
WBC: 12.4 — ABNORMAL HIGH
WBC: 7.8
WBC: 9.9

## 2010-10-20 LAB — COMPREHENSIVE METABOLIC PANEL
ALT: 20
AST: 27
Albumin: 2.4 — ABNORMAL LOW
Alkaline Phosphatase: 164 — ABNORMAL HIGH
BUN: 7
CO2: 26
Calcium: 8.5
Chloride: 101
Chloride: 104
Creatinine, Ser: 0.75
GFR calc Af Amer: >60
GFR calc non Af Amer: >60
Sodium: 135
Total Bilirubin: 0.5
Total Bilirubin: 0.6

## 2010-10-20 LAB — LACTATE DEHYDROGENASE
LDH: 189
LDH: 215

## 2011-06-07 ENCOUNTER — Encounter (HOSPITAL_COMMUNITY): Payer: Self-pay | Admitting: *Deleted

## 2011-06-07 ENCOUNTER — Emergency Department (HOSPITAL_COMMUNITY)
Admission: EM | Admit: 2011-06-07 | Discharge: 2011-06-07 | Disposition: A | Discharge status: Home / Self Care | Payer: Self-pay | Attending: Emergency Medicine | Admitting: Emergency Medicine

## 2011-06-07 DIAGNOSIS — J069 Acute upper respiratory infection, unspecified: Secondary | ICD-10-CM | POA: Insufficient documentation

## 2011-06-07 DIAGNOSIS — H9209 Otalgia, unspecified ear: Secondary | ICD-10-CM | POA: Insufficient documentation

## 2011-06-07 DIAGNOSIS — IMO0001 Reserved for inherently not codable concepts without codable children: Secondary | ICD-10-CM | POA: Insufficient documentation

## 2011-06-07 DIAGNOSIS — R070 Pain in throat: Secondary | ICD-10-CM | POA: Insufficient documentation

## 2011-06-07 DIAGNOSIS — I10 Essential (primary) hypertension: Secondary | ICD-10-CM | POA: Insufficient documentation

## 2011-06-07 DIAGNOSIS — J45909 Unspecified asthma, uncomplicated: Secondary | ICD-10-CM | POA: Insufficient documentation

## 2011-06-07 DIAGNOSIS — R599 Enlarged lymph nodes, unspecified: Secondary | ICD-10-CM | POA: Insufficient documentation

## 2011-06-07 HISTORY — DX: Essential (primary) hypertension: I10

## 2011-06-07 LAB — URINALYSIS, ROUTINE W REFLEX MICROSCOPIC
Ketones, ur: NEGATIVE mg/dL
Leukocytes, UA: NEGATIVE
Nitrite: NEGATIVE
Urobilinogen, UA: 1 mg/dL (ref 0.0–1.0)
pH: 7 (ref 5.0–8.0)

## 2011-06-07 MED ORDER — IBUPROFEN 800 MG PO TABS
800.0000 mg | ORAL_TABLET | Freq: Once | ORAL | Status: AC
  Administered 2011-06-07: 800 mg via ORAL
  Filled 2011-06-07: qty 1

## 2011-06-07 MED ORDER — CETIRIZINE-PSEUDOEPHEDRINE ER 5-120 MG PO TB12: 1.0000 | ORAL_TABLET | Freq: Every day | ORAL | Status: DC

## 2011-06-07 NOTE — ED Provider Notes (Signed)
History     CSN: [id number]  Arrival date & time 06/07/11  0418   First MD Initiated Contact with Patient 06/07/11 0455      Chief Complaint  Patient presents with  . Influenza    (Consider location/radiation/quality/duration/timing/severity/associated sxs/prior treatment) HPI Pt presents with c/o bilateral ear pain, throat pain, swollen glands in neck- pt states symptoms began 2 days ago and have been worsening.  No fever.  No difficulty breathing or swallowing.  She states she has been taking benadryl for her ears and congestion.  There are no other alleviating or modifying factors, there are no other associated systemic symptoms.  She denies cough.  Also c/o diffuse body aches.   Past Medical History  Diagnosis Date  . Asthma   . Hypertension     Past Surgical History  Procedure Date  . Cesarean section   . Vaginal birth after cesarean section     Family History  Problem Relation Age of Onset  . Diabetes Other     History  Substance Use Topics  . Smoking status: Never Smoker   . Smokeless tobacco: Not on file  . Alcohol Use: No    OB History    Grav Para Term Preterm Abortions TAB SAB Ect Mult Living                  Review of Systems ROS reviewed and all otherwise negative except for mentioned in HPI  Allergies  Sulfa antibiotics  Home Medications   Current Outpatient Rx  Name Route Sig Dispense Refill  . ALBUTEROL SULFATE HFA 108 (90 BASE) MCG/ACT IN AERS Inhalation Inhale 2 puffs into the lungs every 6 (six) hours as needed.    Marland Kitchen FERROUS SULFATE 325 (65 FE) MG PO TABS Oral Take 325 mg by mouth daily with breakfast.    . ADULT MULTIVITAMIN W/MINERALS CH Oral Take 1 tablet by mouth daily.    Marland Kitchen CETIRIZINE-PSEUDOEPHEDRINE ER 5-120 MG PO TB12 Oral Take 1 tablet by mouth daily. 30 tablet 0    BP 144/81  Pulse 96  Temp(Src) 99 F (37.2 C) (Oral)  Resp 18  SpO2 98%  LMP 05/26/2011 Vitals reviewed Physical Exam Physical Examination: General  appearance - alert, well appearing, and in no distress Mental status - alert, oriented to person, place, and time Eyes - pupils equal and reactive, extraocular eye movements intact, no conjunctival injection or scleral icterus Mouth - MMM, mild erythema of OP, left greater than right, palate symmetric, uvula midline, no exudate Neck - supple, tender cervical node under angle of left mandible- approx 1.5cm, no fluctuance Chest - clear to auscultation, no wheezes, rales or rhonchi, symmetric air entry Heart - normal rate, regular rhythm, normal S1, S2, no murmurs, rubs, clicks or gallops Abdomen - soft, nontender, nondistended, no masses or organomegaly, nabs Extremities - peripheral pulses normal, no pedal edema, no clubbing or cyanosis Skin - normal coloration and turgor, no rashes, no suspicious skin lesions noted, brisk cap refill  ED Course  Procedures (including critical care time)  Labs Reviewed  URINALYSIS, ROUTINE W REFLEX MICROSCOPIC - Abnormal; Notable for the following:    APPearance CLOUDY (*)    All other components within normal limits  PREGNANCY, URINE  RAPID STREP SCREEN  LAB REPORT - SCANNED   No results found.   1. Upper respiratory infection       MDM  Pt presenting with sore throat, chills, body aches, ear pain.  Strep screen negative  Pt has  reactive lymphn node on left- no sign of abscess or superinfection.  Pt is overall well hydrated and nontoxic in appearance.  Pt discharged with strict return precautions- advised symptomatic care- will prescribed zyrtec D to help with symptoms as well.  Discharged with strict return precautions.  Pt agreeable with plan.        Ethelda Chick, MD 06/10/11 [phone]

## 2011-06-07 NOTE — ED Notes (Signed)
C/o body aches, chills, throat itching, bilateral earaches, swollen neck glands, lower L abd pain. Onset 2d ago. (Denies: fever, nvd), alert, NAD, calm, interactive.

## 2011-06-07 NOTE — Discharge Instructions (Signed)
Return to the ED with any concerns including difficulty breathing or swallowing, fever/chills, vomiting and not able to keep down liquids, decreased level of alertness/lethargy, redness overlying swollen glands, or any other alarming symptoms

## 2011-06-07 NOTE — ED Notes (Signed)
Pt stated that she has been having chills, sore throat, bilateral earaches, and fevers x 2days. She stated that RLQ abdominal pain started this AM. No changes in urinary system per pt. No respiratory or cardiac distress. Will continue to monitor.

## 2011-06-11 ENCOUNTER — Emergency Department (HOSPITAL_COMMUNITY)
Admission: EM | Admit: 2011-06-11 | Discharge: 2011-06-11 | Disposition: A | Discharge status: Home / Self Care | Payer: Medicaid Other | Attending: Emergency Medicine | Admitting: Emergency Medicine

## 2011-06-11 ENCOUNTER — Encounter (HOSPITAL_COMMUNITY): Payer: Self-pay | Admitting: *Deleted

## 2011-06-11 ENCOUNTER — Emergency Department (HOSPITAL_COMMUNITY): Payer: Medicaid Other

## 2011-06-11 DIAGNOSIS — R590 Localized enlarged lymph nodes: Secondary | ICD-10-CM

## 2011-06-11 DIAGNOSIS — R599 Enlarged lymph nodes, unspecified: Secondary | ICD-10-CM | POA: Insufficient documentation

## 2011-06-11 DIAGNOSIS — J45909 Unspecified asthma, uncomplicated: Secondary | ICD-10-CM | POA: Insufficient documentation

## 2011-06-11 DIAGNOSIS — R220 Localized swelling, mass and lump, head: Secondary | ICD-10-CM | POA: Insufficient documentation

## 2011-06-11 DIAGNOSIS — M542 Cervicalgia: Secondary | ICD-10-CM | POA: Insufficient documentation

## 2011-06-11 DIAGNOSIS — R1310 Dysphagia, unspecified: Secondary | ICD-10-CM | POA: Insufficient documentation

## 2011-06-11 DIAGNOSIS — Z79899 Other long term (current) drug therapy: Secondary | ICD-10-CM | POA: Insufficient documentation

## 2011-06-11 DIAGNOSIS — R070 Pain in throat: Secondary | ICD-10-CM | POA: Insufficient documentation

## 2011-06-11 DIAGNOSIS — I10 Essential (primary) hypertension: Secondary | ICD-10-CM | POA: Insufficient documentation

## 2011-06-11 DIAGNOSIS — R259 Unspecified abnormal involuntary movements: Secondary | ICD-10-CM | POA: Insufficient documentation

## 2011-06-11 LAB — CBC
MCH: 21.4 pg — ABNORMAL LOW (ref 26.0–34.0)
MCHC: 30.3 g/dL (ref 30.0–36.0)
MCV: 70.5 fL — ABNORMAL LOW (ref 78.0–100.0)
Platelets: 166 10*3/uL (ref 150–400)
RDW: 15.4 % (ref 11.5–15.5)
WBC: 9.3 10*3/uL (ref 4.0–10.5)

## 2011-06-11 LAB — BASIC METABOLIC PANEL
BUN: 11 mg/dL (ref 6–23)
CO2: 26 mEq/L (ref 19–32)
Chloride: 101 mEq/L (ref 96–112)
Creatinine, Ser: 0.71 mg/dL (ref 0.50–1.10)
GFR calc Af Amer: >90 mL/min (ref >90)

## 2011-06-11 LAB — DIFFERENTIAL
Basophils Absolute: 0 10*3/uL (ref 0.0–0.1)
Eosinophils Absolute: 0.1 10*3/uL (ref 0.0–0.7)
Lymphs Abs: 2.3 10*3/uL (ref 0.7–4.0)
Monocytes Absolute: 1.1 10*3/uL — ABNORMAL HIGH (ref 0.1–1.0)
Monocytes Relative: 12 % (ref 3–12)

## 2011-06-11 MED ORDER — IOHEXOL 300 MG/ML  SOLN
75.0000 mL | Freq: Once | INTRAMUSCULAR | Status: AC | PRN
  Administered 2011-06-11: 75 mL via INTRAVENOUS

## 2011-06-11 MED ORDER — DEXAMETHASONE SODIUM PHOSPHATE 10 MG/ML IJ SOLN
10.0000 mg | Freq: Once | INTRAMUSCULAR | Status: AC
  Administered 2011-06-11: 10 mg via INTRAVENOUS
  Filled 2011-06-11: qty 1

## 2011-06-11 MED ORDER — HYDROCODONE-ACETAMINOPHEN 5-325 MG PO TABS: 1.0000 | ORAL_TABLET | ORAL | Status: AC | PRN

## 2011-06-11 MED ORDER — CLINDAMYCIN PHOSPHATE 600 MG/50ML IV SOLN
600.0000 mg | Freq: Once | INTRAVENOUS | Status: AC
  Administered 2011-06-11: 600 mg via INTRAVENOUS
  Filled 2011-06-11: qty 50

## 2011-06-11 NOTE — Discharge Instructions (Signed)
Your labwork was normal today. Your CT scan showed that the swelling is coming from several large lymph nodes. Please call [facility] in the morning at 9 AM for an appointment with Dr. Lazarus Salines. You have been given a prescription for pain medication. If you develop any trouble breathing this evening, worsening swelling, or any other worrisome symptoms, please return to the ED.  Lymphadenopathy Lymphadenopathy means "disease of the lymph glands." But the term is usually used to describe swollen or enlarged lymph glands, also called lymph nodes. These are the bean-shaped organs found in many locations including the neck, underarm, and groin. Lymph glands are part of the immune system, which fights infections in your body. Lymphadenopathy can occur in just one area of the body, such as the neck, or it can be generalized, with lymph node enlargement in several areas. The nodes found in the neck are the most common sites of lymphadenopathy. CAUSES  When your immune system responds to germs (such as viruses or bacteria ), infection-fighting cells and fluid build up. This causes the glands to grow in size. This is usually not something to worry about. Sometimes, the glands themselves can become infected and inflamed. This is called lymphadenitis. Enlarged lymph nodes can be caused by many diseases:  Bacterial disease, such as strep throat or a skin infection.   Viral disease, such as a common cold.   Other germs, such as lyme disease, tuberculosis, or sexually transmitted diseases.   Cancers, such as lymphoma (cancer of the lymphatic system) or leukemia (cancer of the white blood cells).   Inflammatory diseases such as lupus or rheumatoid arthritis.   Reactions to medications.  Many of the diseases above are rare, but important. This is why you should see your caregiver if you have lymphadenopathy. SYMPTOMS   Swollen, enlarged lumps in the neck, back of the head or other locations.   Tenderness.     Warmth or redness of the skin over the lymph nodes.   Fever.  DIAGNOSIS  Enlarged lymph nodes are often near the source of infection. They can help healthcare providers diagnose your illness. For instance:   Swollen lymph nodes around the jaw might be caused by an infection in the mouth.   Enlarged glands in the neck often signal a throat infection.   Lymph nodes that are swollen in more than one area often indicate an illness caused by a virus.  Your caregiver most likely will know what is causing your lymphadenopathy after listening to your history and examining you. Blood tests, x-rays or other tests may be needed. If the cause of the enlarged lymph node cannot be found, and it does not go away by itself, then a biopsy may be needed. Your caregiver will discuss this with you. TREATMENT  Treatment for your enlarged lymph nodes will depend on the cause. Many times the nodes will shrink to normal size by themselves, with no treatment. Antibiotics or other medicines may be needed for infection. Only take over-the-counter or prescription medicines for pain, discomfort or fever as directed by your caregiver. HOME CARE INSTRUCTIONS  Swollen lymph glands usually return to normal when the underlying medical condition goes away. If they persist, contact your health-care provider. He/she might prescribe antibiotics or other treatments, depending on the diagnosis. Take any medications exactly as prescribed. Keep any follow-up appointments made to check on the condition of your enlarged nodes.  SEEK MEDICAL CARE IF:   Swelling lasts for more than two weeks.   You  have symptoms such as weight loss, night sweats, fatigue or fever that does not go away.   The lymph nodes are hard, seem fixed to the skin or are growing rapidly.   Skin over the lymph nodes is red and inflamed. This could mean there is an infection.  SEEK IMMEDIATE MEDICAL CARE IF:   Fluid starts leaking from the area of the enlarged  lymph node.   You develop a fever of 102 F (38.9 C) or greater.   Severe pain develops (not necessarily at the site of a large lymph node).   You develop chest pain or shortness of breath.   You develop worsening abdominal pain.  MAKE SURE YOU:   Understand these instructions.   Will watch your condition.   Will get help right away if you are not doing well or get worse.  Document Released: 10/26/2007 Document Revised: 01/05/2011 Document Reviewed: 10/26/2007 [facility] Patient Information 2012 [place], [place].

## 2011-06-11 NOTE — ED Provider Notes (Signed)
History     CSN: [id number]  Arrival date & time 06/11/11  4098   First MD Initiated Contact with Patient 06/11/11 [phone]      Chief Complaint  Patient presents with  . Oral Swelling    (Consider location/radiation/quality/duration/timing/severity/associated sxs/prior treatment) HPI History from patient. 36 year old female who presents with neck swelling. She states that she was seen on May 8 for sore throat and a painful area to the left side of her neck. Rapid strep was negative on that visit, and she was told that the painful area was likely due to reactive lymphadenopathy. She states that the swelling and pain has gotten worse. She had an old prescription for amoxicillin at home, and took part of this, and felt as if the swelling went down with this. She presents today, as she has a new sensation of fullness in her throat which had not been present earlier. She additionally endorses difficulty opening her mouth and a sensation as if her tongue is "full". States it has been difficult to eat due to this, and she has had to break her food up into very small pieces. She denies any fever or chills. She has had no nausea or vomiting. She has not noticed any redness overlying the area. Denies dental problems.   Past Medical History  Diagnosis Date  . Asthma   . Hypertension     Past Surgical History  Procedure Date  . Cesarean section   . Vaginal birth after cesarean section     Family History  Problem Relation Age of Onset  . Diabetes Other     History  Substance Use Topics  . Smoking status: Never Smoker   . Smokeless tobacco: Not on file  . Alcohol Use: No    OB History    Grav Para Term Preterm Abortions TAB SAB Ect Mult Living                  Review of Systems  Constitutional: Positive for appetite change. Negative for fever and chills.  HENT: Positive for sore throat, trouble swallowing and neck pain. Negative for ear pain, congestion, rhinorrhea, mouth sores, neck  stiffness and dental problem.   Eyes: Negative for visual disturbance.  Respiratory: Negative for cough, chest tightness and shortness of breath.   Cardiovascular: Negative for chest pain and palpitations.  Gastrointestinal: Negative for nausea, vomiting and abdominal pain.  Musculoskeletal: Negative for myalgias.  Skin: Negative for rash.  Neurological: Negative for dizziness, weakness and headaches.  All other systems reviewed and are negative.    Allergies  Sulfa antibiotics  Home Medications   Current Outpatient Rx  Name Route Sig Dispense Refill  . ALBUTEROL SULFATE HFA 108 (90 BASE) MCG/ACT IN AERS Inhalation Inhale 2 puffs into the lungs every 6 (six) hours as needed.    Marland Kitchen CETIRIZINE-PSEUDOEPHEDRINE ER 5-120 MG PO TB12 Oral Take 1 tablet by mouth daily. 30 tablet 0  . FERROUS SULFATE 325 (65 FE) MG PO TABS Oral Take 325 mg by mouth daily with breakfast.    . HYDROCHLOROTHIAZIDE 12.5 MG PO TABS Oral Take 12.5 mg by mouth daily.      BP 137/89  Pulse 97  Temp(Src) 98.4 F (36.9 C) (Oral)  Resp 18  SpO2 98%  LMP 05/26/2011  Physical Exam  Nursing note and vitals reviewed. Constitutional: She appears well-developed and well-nourished. No distress.       Afebrile, nontoxic appearing, in no respiratory distress.  HENT:  Head: Normocephalic and atraumatic.  Posterior oropharynx clear. No erythema noted. Patient handling secretions. Mild trismus noted on exam, with patient able to open mouth approximately 3 finger breadths. Uvula midline. No tonsillar enlargement or evidence for peritonsillar abscess. No tongue swelling. No dental decay noted. No tenderness to floor of mouth on palpation.  TMs nl. No mastoid tenderness, erythema, or edema.  Eyes: EOM are normal. Pupils are equal, round, and reactive to light.  Neck: Normal range of motion. Neck supple. No tracheal deviation present.       Large, firm, immobile mass located at angle of left jaw. Area is nontender to  palpation. Small, rubbery lymph nodes noted inferior to this.  Cardiovascular: Normal rate, regular rhythm and normal heart sounds.   Pulmonary/Chest: Effort normal.  Musculoskeletal: Normal range of motion.  Lymphadenopathy:    She has cervical adenopathy.  Neurological: She is alert.  Skin: Skin is warm and dry. She is not diaphoretic.  Psychiatric: She has a normal mood and affect.    ED Course  Procedures (including critical care time)  Labs Reviewed  CBC - Abnormal; Notable for the following:    Hemoglobin 9.4 (*)    HCT 31.0 (*)    MCV 70.5 (*)    MCH 21.4 (*)    All other components within normal limits  DIFFERENTIAL - Abnormal; Notable for the following:    Monocytes Absolute 1.1 (*)    All other components within normal limits  BASIC METABOLIC PANEL   Ct Soft Tissue Neck W Contrast  06/11/2011  *RADIOLOGY REPORT*  Clinical Data: Painful swelling in the left neck.  Left neck edema.  CT NECK WITH CONTRAST  Technique:  Multidetector CT imaging of the neck was performed with intravenous contrast.  Contrast: 75mL OMNIPAQUE IOHEXOL 300 MG/ML  SOLN  Comparison: None.  Findings: A dominant left level II neck mass measures 4.5 x 2.2 x 2.1 cm.  The mass is heterogeneous.  Additional hyperdense left level II lymph nodes are present, measuring up to 14 mm in long access.  A left level III lymph node measures 14 mm.  Although ovoid, it is a solid with hyperdense enhancement.  Several rounded sub centimeter level V lymph nodes are present on the left. Hyperdense left level IV lymph nodes measure up to 13 mm.  No significant right-sided adenopathy is present.  There is asymmetric soft tissue in the left palatine tonsil area. No other discrete mucosal or submucosal lesions are evident.  It is difficult to define a discrete mass in the left palatine tonsil.  The vocal cords are midline and symmetric.  The upper mediastinum is within normal limits.  The lung windows are unremarkable.  The bone  windows demonstrate no focal lytic or blastic lesions.  IMPRESSION:  1.  Marked left-sided adenopathy.  The largest nodule is a left level II lymph node measuring 4.5 x 2.2 x 2.1 cm. 2.  Additional left level II, level III, level IV, and level V lymph nodes are worrisome for metastatic disease.  Lymphoma is also considered. 3.  Asymmetric soft tissue within the left palatine tonsil. Recommend attention to this area as a possible primary lesion. If this does represent tumor, consider HPV testing in a patient of this age.  Original Report Authenticated By: Jamesetta Orleans. MATTERN, M.D.     1. Cervical lymphadenopathy       MDM  Patient with gradually increasing swelling and pain to the left side of her neck, which has been increasing over about the past week.  On exam, she is noted to have a firm, immobile mass to the left neck. No evidence for airway compromise. CBC and BMP generally unremarkable. CT of the neck shows marked left-sided adenopathy. Discussed with Dr. Freida Busman, who discussed with Dr. Lazarus Salines. He will see the patient in clinic this week for further workup.  Findings d/w pt; return precautions discussed.        Grant Fontana, [place] 06/11/11 1128

## 2011-06-11 NOTE — ED Provider Notes (Signed)
Medical screening examination/treatment/procedure(s) were conducted as a shared visit with non-physician practitioner(s) and myself.  I personally evaluated the patient during the encounter  Patient with firm hard lymph node in CT results noted. Spoke with ENT on call he will see her this week for a workup  Toy Baker, MD 06/11/11 1118

## 2011-06-11 NOTE — ED Notes (Signed)
Pt reports being seen here earlier this week for swelling and pain to left side of throat, given allergy meds with no relief. Now feels like swelling is increased, airway is intact, resp e/u.

## 2011-06-12 NOTE — ED Provider Notes (Signed)
Medical screening examination/treatment/procedure(s) were conducted as a shared visit with non-physician practitioner(s) and myself.  I personally evaluated the patient during the encounter  Toy Baker, MD 06/12/11 1446

## 2011-06-22 ENCOUNTER — Emergency Department (HOSPITAL_COMMUNITY)
Admission: EM | Admit: 2011-06-22 | Discharge: 2011-06-22 | Disposition: A | Discharge status: Home / Self Care | Payer: Self-pay | Attending: Emergency Medicine | Admitting: Emergency Medicine

## 2011-06-22 ENCOUNTER — Emergency Department (HOSPITAL_COMMUNITY): Payer: Self-pay

## 2011-06-22 ENCOUNTER — Encounter (HOSPITAL_COMMUNITY): Payer: Self-pay

## 2011-06-22 DIAGNOSIS — M542 Cervicalgia: Secondary | ICD-10-CM | POA: Insufficient documentation

## 2011-06-22 DIAGNOSIS — R5381 Other malaise: Secondary | ICD-10-CM | POA: Insufficient documentation

## 2011-06-22 DIAGNOSIS — R51 Headache: Secondary | ICD-10-CM | POA: Insufficient documentation

## 2011-06-22 DIAGNOSIS — H9319 Tinnitus, unspecified ear: Secondary | ICD-10-CM | POA: Insufficient documentation

## 2011-06-22 DIAGNOSIS — J020 Streptococcal pharyngitis: Secondary | ICD-10-CM | POA: Insufficient documentation

## 2011-06-22 DIAGNOSIS — R112 Nausea with vomiting, unspecified: Secondary | ICD-10-CM | POA: Insufficient documentation

## 2011-06-22 DIAGNOSIS — L259 Unspecified contact dermatitis, unspecified cause: Secondary | ICD-10-CM | POA: Insufficient documentation

## 2011-06-22 DIAGNOSIS — R29898 Other symptoms and signs involving the musculoskeletal system: Secondary | ICD-10-CM | POA: Insufficient documentation

## 2011-06-22 DIAGNOSIS — R599 Enlarged lymph nodes, unspecified: Secondary | ICD-10-CM | POA: Insufficient documentation

## 2011-06-22 DIAGNOSIS — R42 Dizziness and giddiness: Secondary | ICD-10-CM | POA: Insufficient documentation

## 2011-06-22 DIAGNOSIS — J45909 Unspecified asthma, uncomplicated: Secondary | ICD-10-CM | POA: Insufficient documentation

## 2011-06-22 DIAGNOSIS — I10 Essential (primary) hypertension: Secondary | ICD-10-CM | POA: Insufficient documentation

## 2011-06-22 DIAGNOSIS — Z79899 Other long term (current) drug therapy: Secondary | ICD-10-CM | POA: Insufficient documentation

## 2011-06-22 DIAGNOSIS — H538 Other visual disturbances: Secondary | ICD-10-CM | POA: Insufficient documentation

## 2011-06-22 DIAGNOSIS — R590 Localized enlarged lymph nodes: Secondary | ICD-10-CM

## 2011-06-22 DIAGNOSIS — R209 Unspecified disturbances of skin sensation: Secondary | ICD-10-CM | POA: Insufficient documentation

## 2011-06-22 DIAGNOSIS — R55 Syncope and collapse: Secondary | ICD-10-CM | POA: Insufficient documentation

## 2011-06-22 DIAGNOSIS — J351 Hypertrophy of tonsils: Secondary | ICD-10-CM | POA: Insufficient documentation

## 2011-06-22 DIAGNOSIS — H9190 Unspecified hearing loss, unspecified ear: Secondary | ICD-10-CM | POA: Insufficient documentation

## 2011-06-22 LAB — CBC
HCT: 29.7 % — ABNORMAL LOW (ref 36.0–46.0)
Hemoglobin: 9.3 g/dL — ABNORMAL LOW (ref 12.0–15.0)
MCH: 22.1 pg — ABNORMAL LOW (ref 26.0–34.0)
MCHC: 31.3 g/dL (ref 30.0–36.0)
MCV: 70.7 fL — ABNORMAL LOW (ref 78.0–100.0)

## 2011-06-22 LAB — DIFFERENTIAL
Basophils Relative: 0 % (ref 0–1)
Eosinophils Absolute: 0 10*3/uL (ref 0.0–0.7)
Eosinophils Relative: 0 % (ref 0–5)
Monocytes Absolute: 1 10*3/uL (ref 0.1–1.0)
Monocytes Relative: 8 % (ref 3–12)

## 2011-06-22 MED ORDER — CEPHALEXIN 250 MG PO CAPS
ORAL_CAPSULE | ORAL | Status: AC
  Administered 2011-06-22: 500 mg
  Filled 2011-06-22: qty 2

## 2011-06-22 MED ORDER — CEPHALEXIN 500 MG PO CAPS: 500.0000 mg | ORAL_CAPSULE | Freq: Four times a day (QID) | ORAL | Status: AC

## 2011-06-22 NOTE — ED Notes (Signed)
Pt c/o sudden onset of headache, blurred vision, and BLE weakness.

## 2011-06-22 NOTE — Discharge Instructions (Signed)
Strep Throat Strep throat is an infection of the throat caused by a bacteria named Streptococcus pyogenes. Your caregiver may call the infection streptococcal "tonsillitis" or "pharyngitis" depending on whether there are signs of inflammation in the tonsils or back of the throat. Strep throat is most common in children from 35 to 34 years old during the cold months of the year, but it can occur in people of any age during any season. This infection is spread from person to person (contagious) through coughing, sneezing, or other close contact. SYMPTOMS   Fever or chills.   Painful, swollen, red tonsils or throat.   Pain or difficulty when swallowing.   White or yellow spots on the tonsils or throat.   Swollen, tender lymph nodes or "glands" of the neck or under the jaw.   Red rash all over the body (rare).  DIAGNOSIS  Many different infections can cause the same symptoms. A test must be done to confirm the diagnosis so the right treatment can be given. A "rapid strep test" can help your caregiver make the diagnosis in a few minutes. If this test is not available, a light swab of the infected area can be used for a throat culture test. If a throat culture test is done, results are usually available in a day or two. TREATMENT  Strep throat is treated with antibiotic medicine. HOME CARE INSTRUCTIONS   Gargle with 1 tsp of salt in 1 cup of warm water, 3 to 4 times per day or as needed for comfort.   Family members who also have a sore throat or fever should be tested for strep throat and treated with antibiotics if they have the strep infection.   Make sure everyone in your household washes their hands well.   Do not share food, drinking cups, or personal items that could cause the infection to spread to others.   You may need to eat a soft food diet until your sore throat gets better.   Drink enough water and fluids to keep your urine clear or pale yellow. This will help prevent  dehydration.   Get plenty of rest.   Stay home from school, daycare, or work until you have been on antibiotics for 24 hours.   Only take over-the-counter or prescription medicines for pain, discomfort, or fever as directed by your caregiver.   If antibiotics are prescribed, take them as directed. Finish them even if you start to feel better.  SEEK MEDICAL CARE IF:   The glands in your neck continue to enlarge.   You develop a rash, cough, or earache.   You cough up green, yellow-brown, or bloody sputum.   You have pain or discomfort not controlled by medicines.   Your problems seem to be getting worse rather than better.  SEEK IMMEDIATE MEDICAL CARE IF:   You develop any new symptoms such as vomiting, severe headache, stiff or painful neck, chest pain, shortness of breath, or trouble swallowing.   You develop severe throat pain, drooling, or changes in your voice.   You develop swelling of the neck, or the skin on the neck becomes red and tender.   You have a fever.   You develop signs of dehydration, such as fatigue, dry mouth, and decreased urination.   You become increasingly sleepy, or you cannot wake up completely.  Document Released: 01/14/2000 Document Revised: 01/05/2011 Document Reviewed: 03/17/2010 [facility] Patient Information 2012 [place], [place].Near-Syncope Near-syncope is sudden weakness, dizziness, or feeling like you might pass  out (faint). This can happen when getting up or while standing for a long time. It can be caused by a drop in blood pressure. It is common in people taking medicine for blood pressure. Fainting can happen when the blood pressure or pulse is too low. HOME CARE  If you feel like you are going to pass out:   Lie down right away.   Breathe deeply and steadily.   Move only when the feeling has gone away. Most of the time, this feeling lasts only a few minutes. You may feel tired for several hours.   Drink enough fluids to keep your pee  (urine) clear or pale yellow.   If you are taking blood pressure or heart medicine, stand up slowly.  GET HELP RIGHT AWAY IF:   You have a severe headache.   Unusual pain develops in the chest, belly (abdomen), or back.   You have bleeding from the mouth or butt (rectum), or you have black or tarry poop (stool).   You feel your heart beat differently than normal, or you have a very fast pulse.   You pass out, or you twitch and shake when you pass out.   You pass out when sitting or lying down.   You feel confused.   You have trouble walking.   You are weak.   You have vision problems.  MAKE SURE YOU:   Understand these instructions.   Will watch your condition.   Will get help right away if you are not doing well or get worse.  Document Released: 07/05/2007 Document Revised: 01/05/2011 Document Reviewed: 03/04/2010 [facility] Patient Information 2012 [place], [place].
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
## 2011-06-22 NOTE — ED Notes (Signed)
Patient denies pain and is resting comfortably.  

## 2011-06-22 NOTE — ED Notes (Signed)
Please do not remove pt from system, she is in the hall way waiting to be d/c

## 2011-06-22 NOTE — ED Notes (Signed)
Pt in gown and on cardiac monitor and pulse ox

## 2011-06-22 NOTE — ED Provider Notes (Signed)
History     CSN: [id number]  Arrival date & time 06/22/11  1018   First MD Initiated Contact with Patient 06/22/11 1041      Chief Complaint  Patient presents with  . Blurred Vision, decreased hearing and numbness in legs.  .     (Consider location/radiation/quality/duration/timing/severity/associated sxs/prior treatment) HPI  Patient is a 36 year old lady with a past medical history of hypertension, asthma, thrombocytopenia, who presents radiation and decreased hearing.  Per patient, when she was eating breakfast in the morning, she had sudden onset of blurry vision and decreased hearing on both sides. The symptoms lasted the for about 5-10 minutes and then gradually disappeared.  At the same time, she felt numb and week in her legs bilaterally, which also lasted about 5-10 minutes and then resolved. Patient didn't fall or injure any part of her body. After that episode,  he patient sit in her chair for a rest. Then she felt nauseated and vomited 2 times of the food material without blood in it. Currently she still has ringing in her ears, right is worse than the left ear.   Of note, patient visited ED on May 12 due to sore throat and a neck fullness. She had a negative rapid strep test. She was found to have a large neck lymph note (4.5 x 2.2 x 2.1 cm). She was prescribed with amoxicillin. She reports that amoxicillin is not helpful to her symptoms. She was evaluated by an ENT doctor, Dr. Conley Simmonds for the possibility of cancer. Patient has an another appointment on tomorrow with Dr. Conley Simmonds.  Denies fever, chills, cough, chest pain, SOB,  abdominal pain,diarrhea, constipation, dysuria, urgency, frequency, hematuria, joint pain or leg swelling.  Past Medical History  Diagnosis Date  . Asthma   . Hypertension     Past Surgical History  Procedure Date  . Cesarean section   . Vaginal birth after cesarean section     Family History  Problem Relation Age of Onset  . Diabetes Other       History  Substance Use Topics  . Smoking status: Never Smoker   . Smokeless tobacco: Not on file  . Alcohol Use: No    OB History    Grav Para Term Preterm Abortions TAB SAB Ect Mult Living                  Review of Systems  Constitutional: Positive for fatigue. Negative for fever, chills and diaphoresis.  HENT: Positive for hearing loss, sore throat, mouth sores and neck pain. Negative for ear pain, nosebleeds, congestion, facial swelling, rhinorrhea, sneezing, voice change, postnasal drip, sinus pressure, tinnitus and ear discharge.   Eyes: Positive for visual disturbance. Negative for photophobia, pain, discharge, redness and itching.  Respiratory: Negative for apnea, cough, choking, chest tightness, shortness of breath, wheezing and stridor.   Cardiovascular: Negative for chest pain, palpitations and leg swelling.  Gastrointestinal: Positive for nausea and vomiting. Negative for abdominal pain, diarrhea, constipation, blood in stool, abdominal distention and anal bleeding.  Genitourinary: Negative for dysuria, urgency, frequency, hematuria, flank pain, difficulty urinating and dyspareunia.  Musculoskeletal: Negative for myalgias, back pain, joint swelling, arthralgias and gait problem.  Skin: Positive for rash. Negative for color change, pallor and wound.       Patient has eczema on her right ankle area.  Neurological: Positive for dizziness, weakness, numbness and headaches. Negative for tremors, seizures, syncope, facial asymmetry, speech difficulty and light-headedness.  Hematological: Positive for adenopathy. Does not bruise/bleed easily.  There is a large node on L neck.  Psychiatric/Behavioral: Negative for suicidal ideas, hallucinations, confusion, sleep disturbance, self-injury and agitation. The patient is not nervous/anxious and is not hyperactive.     Allergies  Sulfa antibiotics and Latex  Home Medications   Current Outpatient Rx  Name Route Sig  Dispense Refill  . ALBUTEROL SULFATE HFA 108 (90 BASE) MCG/ACT IN AERS Inhalation Inhale 2 puffs into the lungs every 6 (six) hours as needed.    Marland Kitchen CETIRIZINE HCL 10 MG PO TABS Oral Take 10 mg by mouth daily.    Marland Kitchen HYDROCHLOROTHIAZIDE 12.5 MG PO TABS Oral Take 12.5 mg by mouth daily.    Marland Kitchen HYDROCODONE-ACETAMINOPHEN 5-325 MG PO TABS Oral Take 1 tablet by mouth every 4 (four) hours as needed. For pain    . HYDROCODONE-ACETAMINOPHEN 5-325 MG PO TABS Oral Take 1 tablet by mouth every 4 (four) hours as needed for pain. 15 tablet 0    BP 135/84  Pulse 97  Temp(Src) 99.1 F (37.3 C) (Oral)  Resp 14  SpO2 100%  LMP 06/21/2011  Physical Exam  Constitutional: Vital signs reviewed.  Patient is a well-developed and well-nourished, not in acute distress and cooperative with exam.  Head: Normocephalic and atraumatic Ear: TM normal bilaterally Mouth: no erythema or exudates, MMM. Tonsillar enlargement with gray colored exudate bilaterally.  Eyes: PERRL, EOMI, conjunctivae normal, No scleral icterus.  Neck: Supple, Trachea midline normal ROM. There is an enlarged lymph node on the left neck, moveable, proximately 4 X 2 X2 cm in size. No JVD or carotid bruit  Cardiovascular: RRR, S1 normal, S2 normal, no MRG, pulses symmetric and intact bilaterally Pulmonary/Chest: CTAB, no wheezes, rales, or rhonchi Abdominal: Soft. Non-tender, non-distended, bowel sounds are normal, no masses, organomegaly, or guarding present.  GU: no CVA tenderness.  Musculoskeletal: No joint deformities, erythema, or stiffness, ROM full and nontender.  Neurological: A&O x3, Strenght is normal and symmetric bilaterally, cranial nerve II-XII are grossly intact, no focal motor deficit, sensory intact to light touch bilaterally. negative Babinski's sign. Skin: Warm and intact. No cyanosis.  Psychiatric: Normal mood and affect. speech and behavior is normal. Judgment and thought content normal. Cognition and memory are normal.   ED  Course  Procedures (including critical care time)  Patient's vital signs are stable in ED. No specific treatment was given. CT head without contrast was ordered. Lab tests include CBC with differential, mononucleosis screening test. Will repeat rapid strep screen test given her tonsillar exudation.     Labs Reviewed  RAPID STREP SCREEN  CBC  DIFFERENTIAL  MONONUCLEOSIS SCREEN   No results found.   No diagnosis found.    MDM  The etiology for patient's symptoms (blurry vision, decreased hearing and numbness/weakness in legs bilaterally) is not clear now. Differential diagnosis includes syncope, MS, metastasis disease in brian, etc.  It is less likely due to stroke or TIA giving the bilateral weakness and numbness. The differential diagnosis of her enlargement of tonsil with exudate and enlargement of neck node include mononucleosis, strep tonsillitis and HIV, etc. She reports that she had an negative HIV test last week (I could not find a record in our system).   Patient CT scan of the head is normal. Patient probably had a near syncope episode. Mononucleosis test is negative. Her rapid strep screening test is positive. Her neck lymphadenopathy can be partially explained by her strep tonsillitis. She completed 10 days of amoxicillin treatment previously. But she took her daughter's amoxicillin in first  7 days. It is not clear whether that was a full dose for adult. Patient will be treated with Keflex for another 7 days. Patient is to follow up with ENT doctor, Dr. Conley Simmonds on tomorrow.     Lorretta Harp, MD 06/22/11 1401

## 2011-06-26 NOTE — ED Provider Notes (Signed)
I saw and evaluated the patient, reviewed the resident's note and I agree with the findings and plan.  I saw the patient along with Dr. Clyde Lundborg and I agree with his note, assessment, and plan.  The patient presents complaining of sore throat and pain in the right side of the neck.  She was seen several days ago for similar complaints and had a ct of the neck performed.  This revealed several lymph nodes that were suspicious for metastatic disease.  She was treated with amox and is due to follow up with ent tomorrow.  Her sore throat worsened today and she had an episode of dizziness, near-syncope, and blurry vision that brings her here today.  On exam, vitals are stable and she is afebrile.  On exam, the heart is regular rate and rhythm without murmurs.  The lungs are clear.  The PO is noted to have enlarged tonsils with exudates.  There is right-sided cervical lymphadenopathy.  Workup was initiated including ct of the head and lab tests.  These were unremarkable.  The mono test was negative.  Interestingly, the strep test was positive.  I am uncertain how this relates to today's complaints and the prior ct findings, however she appears stable and otherwise in no distress.  She will be treated with keflex and discharged to home.  She has a follow up appointment with Dr. Lazarus Salines in the AM.  Geoffery Lyons, MD 06/26/11 [phone]

## 2012-11-04 ENCOUNTER — Emergency Department (HOSPITAL_COMMUNITY)
Admission: EM | Admit: 2012-11-04 | Discharge: 2012-11-04 | Disposition: A | Discharge status: Home / Self Care | Payer: No Typology Code available for payment source | Attending: Emergency Medicine | Admitting: Emergency Medicine

## 2012-11-04 ENCOUNTER — Encounter (HOSPITAL_COMMUNITY): Payer: Self-pay | Admitting: Emergency Medicine

## 2012-11-04 DIAGNOSIS — Y9389 Activity, other specified: Secondary | ICD-10-CM | POA: Insufficient documentation

## 2012-11-04 DIAGNOSIS — S8990XA Unspecified injury of unspecified lower leg, initial encounter: Secondary | ICD-10-CM | POA: Insufficient documentation

## 2012-11-04 DIAGNOSIS — Z91040 Latex allergy status: Secondary | ICD-10-CM | POA: Insufficient documentation

## 2012-11-04 DIAGNOSIS — Y92410 Unspecified street and highway as the place of occurrence of the external cause: Secondary | ICD-10-CM | POA: Insufficient documentation

## 2012-11-04 DIAGNOSIS — D649 Anemia, unspecified: Secondary | ICD-10-CM | POA: Insufficient documentation

## 2012-11-04 DIAGNOSIS — S63509A Unspecified sprain of unspecified wrist, initial encounter: Secondary | ICD-10-CM | POA: Insufficient documentation

## 2012-11-04 DIAGNOSIS — Z79899 Other long term (current) drug therapy: Secondary | ICD-10-CM | POA: Insufficient documentation

## 2012-11-04 DIAGNOSIS — S0990XA Unspecified injury of head, initial encounter: Secondary | ICD-10-CM | POA: Insufficient documentation

## 2012-11-04 DIAGNOSIS — S63502A Unspecified sprain of left wrist, initial encounter: Secondary | ICD-10-CM

## 2012-11-04 DIAGNOSIS — J45909 Unspecified asthma, uncomplicated: Secondary | ICD-10-CM | POA: Insufficient documentation

## 2012-11-04 DIAGNOSIS — I10 Essential (primary) hypertension: Secondary | ICD-10-CM | POA: Insufficient documentation

## 2012-11-04 HISTORY — DX: Anemia, unspecified: D64.9

## 2012-11-04 MED ORDER — CYCLOBENZAPRINE HCL 10 MG PO TABS: 10.0000 mg | ORAL_TABLET | Freq: Two times a day (BID) | ORAL | Status: DC | PRN

## 2012-11-04 MED ORDER — NAPROXEN 500 MG PO TABS: 500.0000 mg | ORAL_TABLET | Freq: Two times a day (BID) | ORAL | Status: DC
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
## 2012-11-04 NOTE — ED Notes (Signed)
Ambulatory to tx room. C/o neck pain and left wrist pain. No deformity.

## 2012-11-04 NOTE — ED Provider Notes (Signed)
Medical screening examination/treatment/procedure(s) were performed by non-physician practitioner and as supervising physician I was immediately available for consultation/collaboration.  Geoffery Lyons, MD 11/04/12 [phone]
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
## 2012-12-05 ENCOUNTER — Encounter (HOSPITAL_COMMUNITY): Payer: Self-pay | Admitting: Emergency Medicine

## 2012-12-05 ENCOUNTER — Emergency Department (HOSPITAL_COMMUNITY)
Admission: EM | Admit: 2012-12-05 | Discharge: 2012-12-05 | Disposition: A | Discharge status: Home / Self Care | Payer: Medicaid Other | Attending: Emergency Medicine | Admitting: Emergency Medicine

## 2012-12-05 DIAGNOSIS — Z79899 Other long term (current) drug therapy: Secondary | ICD-10-CM | POA: Insufficient documentation

## 2012-12-05 DIAGNOSIS — I10 Essential (primary) hypertension: Secondary | ICD-10-CM | POA: Insufficient documentation

## 2012-12-05 DIAGNOSIS — J45909 Unspecified asthma, uncomplicated: Secondary | ICD-10-CM | POA: Insufficient documentation

## 2012-12-05 DIAGNOSIS — D649 Anemia, unspecified: Secondary | ICD-10-CM | POA: Insufficient documentation

## 2012-12-05 DIAGNOSIS — L508 Other urticaria: Secondary | ICD-10-CM

## 2012-12-05 DIAGNOSIS — Z91040 Latex allergy status: Secondary | ICD-10-CM | POA: Insufficient documentation

## 2012-12-05 DIAGNOSIS — L500 Allergic urticaria: Secondary | ICD-10-CM | POA: Insufficient documentation

## 2012-12-05 MED ORDER — PREDNISONE 10 MG PO TABS: 20.0000 mg | ORAL_TABLET | Freq: Every day | ORAL | Status: DC

## 2012-12-05 MED ORDER — FAMOTIDINE 20 MG PO TABS: 20.0000 mg | ORAL_TABLET | Freq: Two times a day (BID) | ORAL | Status: DC

## 2012-12-05 MED ORDER — DIPHENHYDRAMINE HCL 25 MG PO TABS: 25.0000 mg | ORAL_TABLET | Freq: Four times a day (QID) | ORAL | Status: DC

## 2012-12-05 NOTE — ED Provider Notes (Signed)
Medical screening examination/treatment/procedure(s) were performed by non-physician practitioner and as supervising physician I was immediately available for consultation/collaboration.  EKG Interpretation   None         Dagmar Hait, MD 12/05/12 [phone]

## 2012-12-05 NOTE — ED Notes (Signed)
Pt here for welps to arms and torso, onset yesterday morning, sts no relief with peppermint oil or oregano. Pt  sts unknown what she was exposed to but she has used new lotions from [employer].

## 2012-12-05 NOTE — ED Provider Notes (Signed)
CSN: [id number]     Arrival date & time 12/05/12  0906 History  This chart was scribed for non-physician practitioner Fayrene Helper, PA-C working with Dagmar Hait, MD by Leone Payor, ED Scribe. This patient was seen in room TR05C/TR05C and the patient's care was started at 0906.    Chief Complaint  Patient presents with  . Allergic Reaction    The history is provided by the patient. No language interpreter was used.    HPI Comments: Brenda Mcintyre is a 37 y.o. female who presents to the Emergency Department complaining of 1 day of gradual onset, gradually improving, constant allergic reaction. Pt states she has redness and itching to her back, trunk, and all extremities. Pt states she has used new soaps and lotions from [employer] and was also helping a friend move 2 days ago. She reports having a similar, single episode in the past but it was not as severe. She denies new pets, new medications. She has tried applying oregano and peppermint oil without relief. She denies trouble swallowing, fever, SOB, abdominal pain, nausea, vomiting.   Past Medical History  Diagnosis Date  . Asthma   . Hypertension   . Anemia    Past Surgical History  Procedure Laterality Date  . Cesarean section    . Vaginal birth after cesarean section     Family History  Problem Relation Age of Onset  . Diabetes Other    History  Substance Use Topics  . Smoking status: Never Smoker   . Smokeless tobacco: Not on file  . Alcohol Use: Yes     Comment: occ   OB History   Grav Para Term Preterm Abortions TAB SAB Ect Mult Living                 Review of Systems  Constitutional: Negative for fever.  HENT: Negative for sore throat and trouble swallowing.   Respiratory: Negative for shortness of breath.   Gastrointestinal: Negative for nausea, vomiting and abdominal pain.  Skin: Positive for rash.    Allergies  Sulfa antibiotics and Latex  Home Medications   Current Outpatient Rx   Name  Route  Sig  Dispense  Refill  . albuterol (PROVENTIL HFA;VENTOLIN HFA) 108 (90 BASE) MCG/ACT inhaler   Inhalation   Inhale 2 puffs into the lungs every 6 (six) hours as needed.         . diphenhydrAMINE (SOMINEX) 25 MG tablet   Oral   Take 100 mg by mouth at bedtime as needed for allergies (allergic reaction).         . Fluticasone-Salmeterol (ADVAIR DISKUS IN)   Inhalation   Inhale 2 puffs into the lungs daily.         . Iron TABS   Oral   Take 2 tablets by mouth daily.         Marland Kitchen losartan (COZAAR) 50 MG tablet   Oral   Take 50 mg by mouth daily.          BP 148/99  Pulse 78  Temp(Src) 98.8 F (37.1 C) (Oral)  Resp 20  Ht 5\' 7"  (1.702 m)  Wt 227 lb (102.967 kg)  BMI 35.55 kg/m2  SpO2 100% Physical Exam  Nursing note and vitals reviewed. Constitutional: She is oriented to person, place, and time. She appears well-developed and well-nourished. No distress.  HENT:  Head: Atraumatic.  Mouth/Throat: Oropharynx is clear and moist. No posterior oropharyngeal edema or posterior oropharyngeal erythema.  No involvement of the throat.   Eyes: Conjunctivae and EOM are normal.  Neck: Normal range of motion.  Cardiovascular: Normal rate, regular rhythm and normal heart sounds.   Pulmonary/Chest: Effort normal and breath sounds normal. No respiratory distress. She has no wheezes. She has no rales.  Abdominal: Soft. There is no tenderness.  Musculoskeletal: Normal range of motion. She exhibits no tenderness.  Neurological: She is alert and oriented to person, place, and time.  Skin: Skin is warm and dry. She is not diaphoretic.  Multiple urticarial hives noted throughout body involving the back, trunk, thigh, bilateral forearms, bilateral lower legs.   Psychiatric: She has a normal mood and affect. Her behavior is normal.    ED Course  Procedures   DIAGNOSTIC STUDIES: Oxygen Saturation is 96% on RA, adequate by my interpretation.    COORDINATION OF  CARE: 9:33 AM Discussed treatment plan with pt at bedside and pt agreed to plan. No pathologic rash, no red flags, no systemic involvement.  No airway compromised, no signs of infection.  Urticarial rash likely 2/2 body wash and skin lotion.  Pt made aware to avoid use.  Will treat with prednisone/benadryl/pepcid.  Return precaution discussed.    Labs Review Labs Reviewed - No data to display Imaging Review No results found.  EKG Interpretation   None       MDM   1. Urticaria, acute    BP 148/99  Pulse 78  Temp(Src) 98.8 F (37.1 C) (Oral)  Resp 20  Ht 5\' 7"  (1.702 m)  Wt 227 lb (102.967 kg)  BMI 35.55 kg/m2  SpO2 100%   I personally performed the services described in this documentation, which was scribed in my presence. The recorded information has been reviewed and is accurate.    Fayrene Helper, PA-C 12/05/12 [phone]

## 2014-01-14 ENCOUNTER — Ambulatory Visit: Payer: Medicaid Other

## 2014-05-17 ENCOUNTER — Encounter (HOSPITAL_COMMUNITY): Payer: Self-pay | Admitting: *Deleted

## 2014-05-17 ENCOUNTER — Emergency Department (HOSPITAL_COMMUNITY)
Admission: EM | Admit: 2014-05-17 | Discharge: 2014-05-18 | Disposition: A | Discharge status: Home / Self Care | Payer: Self-pay | Attending: Emergency Medicine | Admitting: Emergency Medicine

## 2014-05-17 DIAGNOSIS — Z91040 Latex allergy status: Secondary | ICD-10-CM | POA: Insufficient documentation

## 2014-05-17 DIAGNOSIS — R51 Headache: Secondary | ICD-10-CM | POA: Insufficient documentation

## 2014-05-17 DIAGNOSIS — Z7951 Long term (current) use of inhaled steroids: Secondary | ICD-10-CM | POA: Insufficient documentation

## 2014-05-17 DIAGNOSIS — Z79899 Other long term (current) drug therapy: Secondary | ICD-10-CM | POA: Insufficient documentation

## 2014-05-17 DIAGNOSIS — D649 Anemia, unspecified: Secondary | ICD-10-CM | POA: Insufficient documentation

## 2014-05-17 DIAGNOSIS — I10 Essential (primary) hypertension: Secondary | ICD-10-CM | POA: Insufficient documentation

## 2014-05-17 DIAGNOSIS — J45909 Unspecified asthma, uncomplicated: Secondary | ICD-10-CM | POA: Insufficient documentation

## 2014-05-17 DIAGNOSIS — R079 Chest pain, unspecified: Secondary | ICD-10-CM | POA: Insufficient documentation

## 2014-05-17 MED ORDER — LABETALOL HCL 5 MG/ML IV SOLN
10.0000 mg | Freq: Once | INTRAVENOUS | Status: AC
Start: 2014-05-17 — End: 2014-05-17
  Administered 2014-05-17: 10 mg via INTRAVENOUS
  Filled 2014-05-17: qty 4

## 2014-05-17 NOTE — ED Provider Notes (Signed)
CSN: [id number]     Arrival date & time 05/17/14  2251 History  This chart was scribed for [name], MD by Tanda Rockers, ED Scribe. This patient was seen in room A05C/A05C and the patient's care was started at 11:20 PM.    Chief Complaint  Patient presents with  . Chest Pain   The history is provided by the patient. No language interpreter was used.     HPI Comments: Brenda Mcintyre is a 39 y.o. female with hx HTN who presents to the Emergency Department complaining of intermittent, mid chest pain that began this morning. She states that she was laying in bed when the pain began.Pt states that the pain began in her left chest and eventually moved to the mid chest. Pt describes it as a squeezing sensation.  She also mentions a headache that is gradually improving. Pt reports that she usually gets headaches when her blood pressure is elevated. Pt did not check her blood pressure today. She denies diaphoresis, shortness of breath, nausea, vomiting, or any other symptoms.    Past Medical History  Diagnosis Date  . Asthma   . Hypertension   . Anemia    Past Surgical History  Procedure Laterality Date  . Cesarean section    . Vaginal birth after cesarean section     Family History  Problem Relation Age of Onset  . Diabetes Other    History  Substance Use Topics  . Smoking status: Never Smoker   . Smokeless tobacco: Not on file  . Alcohol Use: Yes     Comment: occ   OB History    No data available     Review of Systems  Constitutional: Negative for diaphoresis.  Respiratory: Negative for shortness of breath.   Cardiovascular: Positive for chest pain. Negative for leg swelling.  Gastrointestinal: Negative for nausea and vomiting.  Neurological: Positive for headaches.  All other systems reviewed and are negative.     Allergies  Sulfa antibiotics and Latex  Home Medications   Prior to Admission medications   Medication Sig Start Date End Date  Taking? Authorizing Provider  albuterol (PROVENTIL HFA;VENTOLIN HFA) 108 (90 BASE) MCG/ACT inhaler Inhale 2 puffs into the lungs every 6 (six) hours as needed for wheezing or shortness of breath.    Yes Historical Provider, MD  cetirizine (ZYRTEC) 10 MG tablet Take 10 mg by mouth daily as needed for allergies.   Yes Historical Provider, MD  diphenhydrAMINE (BENADRYL) 25 MG tablet Take 1 tablet (25 mg total) by mouth every 6 (six) hours. Patient taking differently: Take 25 mg by mouth every 6 (six) hours as needed for itching or allergies.  12/05/12  Yes Fayrene Helper, PA-C  Iron TABS Take 2 tablets by mouth daily.   Yes Historical Provider, MD  Multiple Vitamin (MULTIVITAMIN WITH MINERALS) TABS tablet Take 2 tablets by mouth daily.   Yes Historical Provider, MD  diphenhydrAMINE (SOMINEX) 25 MG tablet Take 100 mg by mouth at bedtime as needed for allergies (allergic reaction).    Historical Provider, MD  famotidine (PEPCID) 20 MG tablet Take 1 tablet (20 mg total) by mouth 2 (two) times daily. Patient not taking: Reported on 05/17/2014 12/05/12   Fayrene Helper, PA-C  Fluticasone-Salmeterol (ADVAIR DISKUS IN) Inhale 2 puffs into the lungs daily.    Historical Provider, MD  losartan (COZAAR) 50 MG tablet Take 50 mg by mouth daily.    Historical Provider, MD  predniSONE (DELTASONE) 10 MG tablet Take 2  tablets (20 mg total) by mouth daily. Patient not taking: Reported on 05/17/2014 12/05/12   Fayrene Helper, PA-C   Triage Vitals: BP 158/88 mmHg  Pulse 72  Temp(Src) 98.4 F (36.9 C) (Oral)  Resp 16  Ht  (1.702 m)  Wt 245 lb 8 oz (111.358 kg)  BMI 38.44 kg/m2  SpO2 97%  LMP 05/16/2014   Physical Exam  Constitutional: She is oriented to person, place, and time. She appears well-developed and well-nourished. No distress.  HENT:  Head: Normocephalic and atraumatic.  Right Ear: Hearing normal.  Left Ear: Hearing normal.  Nose: Nose normal.  Mouth/Throat: Oropharynx is clear and moist and mucous membranes  are normal.  Eyes: Conjunctivae and EOM are normal. Pupils are equal, round, and reactive to light.  Neck: Normal range of motion. Neck supple.  Cardiovascular: Normal rate, regular rhythm, S1 normal and S2 normal.  Exam reveals no gallop and no friction rub.   No murmur heard. Pulmonary/Chest: Effort normal and breath sounds normal. No respiratory distress. She exhibits no tenderness.  Abdominal: Soft. Normal appearance and bowel sounds are normal. There is no hepatosplenomegaly. There is no tenderness. There is no rebound, no guarding, no tenderness at McBurney's point and negative Murphy's sign. No hernia.  Musculoskeletal: Normal range of motion.  Neurological: She is alert and oriented to person, place, and time. She has normal strength. No cranial nerve deficit or sensory deficit. Coordination normal. GCS eye subscore is 4. GCS verbal subscore is 5. GCS motor subscore is 6.  Skin: Skin is warm, dry and intact. No rash noted. No cyanosis.  Psychiatric: She has a normal mood and affect. Her speech is normal and behavior is normal. Thought content normal.  Nursing note and vitals reviewed.   ED Course  Procedures (including critical care time)  DIAGNOSTIC STUDIES: Oxygen Saturation is 97% on RA, normal by my interpretation.    COORDINATION OF CARE: 11:23 PM-Discussed treatment plan which includes CBC, BMP, Troponin with pt at bedside and pt agreed to plan.   Labs Review Labs Reviewed  CBC WITH DIFFERENTIAL/PLATELET - Abnormal; Notable for the following:    Hemoglobin 8.7 (*)    HCT 30.7 (*)    MCV 69.3 (*)    MCH 19.6 (*)    MCHC 28.3 (*)    RDW 16.7 (*)    All other components within normal limits  BASIC METABOLIC PANEL - Abnormal; Notable for the following:    Glucose, Bld 116 (*)    GFR calc non Af Amer 82 (*)    All other components within normal limits  TROPONIN I  TROPONIN I  POC OCCULT BLOOD, ED    Imaging Review Dg Chest 2 View  05/18/2014   CLINICAL DATA:   Initial evaluation for acute left-sided chest pain.  EXAM: CHEST  2 VIEW  COMPARISON:  None.  FINDINGS: The cardiac and mediastinal silhouettes are within normal limits.  The lungs are normally inflated. No airspace consolidation, pleural effusion, or pulmonary edema is identified. There is no pneumothorax.  No acute osseous abnormality identified.  IMPRESSION: No active cardiopulmonary disease.   Electronically Signed   By: Rise Mu M.D.   On: 05/18/2014 01:54     EKG Interpretation   Date/Time:  Sunday May 17 2014 22:57:01 EDT Ventricular Rate:  76 PR Interval:  166 QRS Duration: 80 QT Interval:  390 QTC Calculation: 438 R Axis:   60 Text Interpretation:  Normal sinus rhythm Nonspecific T wave abnormality  Abnormal ECG  No previous tracing Confirmed by POLLINA  MD, CHRISTOPHER  [phone] on 05/17/2014 11:15:16 PM      MDM   Final diagnoses:  Chest pain  Essential hypertension   Patient presents to the ER for evaluation of chest pain. She reports intermittent episodes of chest pain over the course of today, but was not experiencing pain at arrival to the ER. Pain was atypical, nonexertional in nature. HEART score is 1 (hypertension). She is felt to be exceptionally low risk for cardiac etiology. Troponin negative 2. Patient is appropriate for outpatient follow-up.  Patient also complaining of headache. She reports that the headache is similar to what she has had with elevated blood pressures in the past. Blood pressure was mildly elevated at arrival, has improved with treatment. She is not taking her blood pressure medication, has been off of it for approximately a year. Patient will be restarted on her Cozaar, follow-up for outpatient management of elevated blood pressure. Return if symptoms worsen.  I personally performed the services described in this documentation, which was scribed in my presence. The recorded information has been reviewed and is  accurate.       Gilda Creasehristopher J Pollina, MD 05/18/14 [phone]

## 2014-05-17 NOTE — ED Notes (Signed)
The pt is c/o mid-chest pain since early this am   She is also c/o a headache.  lmop  yesterday

## 2014-05-18 ENCOUNTER — Emergency Department (HOSPITAL_COMMUNITY): Payer: Medicaid Other

## 2014-05-18 LAB — BASIC METABOLIC PANEL
ANION GAP: 8 (ref 5–15)
BUN: 8 mg/dL (ref 6–23)
CALCIUM: 8.8 mg/dL (ref 8.4–10.5)
CHLORIDE: 105 mmol/L (ref 96–112)
CO2: 26 mmol/L (ref 19–32)
CREATININE: 0.89 mg/dL (ref 0.50–1.10)
GFR calc Af Amer: >90 mL/min (ref >90)
GFR calc non Af Amer: 82 mL/min — ABNORMAL LOW (ref >90)
GLUCOSE: 116 mg/dL — AB (ref 70–99)
Potassium: 3.7 mmol/L (ref 3.5–5.1)
Sodium: 139 mmol/L (ref 135–145)

## 2014-05-18 LAB — CBC WITH DIFFERENTIAL/PLATELET
BASOS ABS: 0 10*3/uL (ref 0.0–0.1)
Basophils Relative: 0 % (ref 0–1)
EOS PCT: 2 % (ref 0–5)
Eosinophils Absolute: 0.1 10*3/uL (ref 0.0–0.7)
HCT: 30.7 % — ABNORMAL LOW (ref 36.0–46.0)
Hemoglobin: 8.7 g/dL — ABNORMAL LOW (ref 12.0–15.0)
Lymphocytes Relative: 40 % (ref 12–46)
Lymphs Abs: 2.5 10*3/uL (ref 0.7–4.0)
MCH: 19.6 pg — AB (ref 26.0–34.0)
MCHC: 28.3 g/dL — ABNORMAL LOW (ref 30.0–36.0)
MCV: 69.3 fL — ABNORMAL LOW (ref 78.0–100.0)
MONO ABS: 0.4 10*3/uL (ref 0.1–1.0)
Monocytes Relative: 7 % (ref 3–12)
NEUTROS PCT: 51 % (ref 43–77)
Neutro Abs: 3.3 10*3/uL (ref 1.7–7.7)
PLATELETS: 243 10*3/uL (ref 150–400)
RBC: 4.43 MIL/uL (ref 3.87–5.11)
RDW: 16.7 % — ABNORMAL HIGH (ref 11.5–15.5)
WBC: 6.3 10*3/uL (ref 4.0–10.5)

## 2014-05-18 LAB — TROPONIN I: Troponin I: <0.03 ng/mL (ref <0.031)

## 2014-05-18 MED ORDER — LOSARTAN POTASSIUM 50 MG PO TABS: 50.0000 mg | ORAL_TABLET | Freq: Every day | ORAL | Status: DC

## 2014-05-18 NOTE — ED Notes (Signed)
Pt refused Occult blood sample; Md notified

## 2014-05-18 NOTE — Discharge Instructions (Signed)
Chest Pain (Nonspecific) °It is often hard to give a specific diagnosis for the cause of chest pain. There is always a chance that your pain could be related to something serious, such as a heart attack or a blood clot in the lungs. You need to follow up with your health care provider for further evaluation. °CAUSES  °· Heartburn. °· Pneumonia or bronchitis. °· Anxiety or stress. °· Inflammation around your heart (pericarditis) or lung (pleuritis or pleurisy). °· A blood clot in the lung. °· A collapsed lung (pneumothorax). It can develop suddenly on its own (spontaneous pneumothorax) or from trauma to the chest. °· Shingles infection (herpes zoster virus). °The chest wall is composed of bones, muscles, and cartilage. Any of these can be the source of the pain. °· The bones can be bruised by injury. °· The muscles or cartilage can be strained by coughing or overwork. °· The cartilage can be affected by inflammation and become sore (costochondritis). °DIAGNOSIS  °Lab tests or other studies may be needed to find the cause of your pain. Your health care provider may have you take a test called an ambulatory electrocardiogram (ECG). An ECG records your heartbeat patterns over a 24-hour period. You may also have other tests, such as: °· Transthoracic echocardiogram (TTE). During echocardiography, sound waves are used to evaluate how blood flows through your heart. °· Transesophageal echocardiogram (TEE). °· Cardiac monitoring. This allows your health care provider to monitor your heart rate and rhythm in real time. °· Holter monitor. This is a portable device that records your heartbeat and can help diagnose heart arrhythmias. It allows your health care provider to track your heart activity for several days, if needed. °· Stress tests by exercise or by giving medicine that makes the heart beat faster. °TREATMENT  °· Treatment depends on what may be causing your chest pain. Treatment may include: °· Acid blockers for  heartburn. °· Anti-inflammatory medicine. °· Pain medicine for inflammatory conditions. °· Antibiotics if an infection is present. °· You may be advised to change lifestyle habits. This includes stopping smoking and avoiding alcohol, caffeine, and chocolate. °· You may be advised to keep your head raised (elevated) when sleeping. This reduces the chance of acid going backward from your stomach into your esophagus. °Most of the time, nonspecific chest pain will improve within 2-3 days with rest and mild pain medicine.  °HOME CARE INSTRUCTIONS  °· If antibiotics were prescribed, take them as directed. Finish them even if you start to feel better. °· For the next few days, avoid physical activities that bring on chest pain. Continue physical activities as directed. °· Do not use any tobacco products, including cigarettes, chewing tobacco, or electronic cigarettes. °· Avoid drinking alcohol. °· Only take medicine as directed by your health care provider. °· Follow your health care provider's suggestions for further testing if your chest pain does not go away. °· Keep any follow-up appointments you made. If you do not go to an appointment, you could develop lasting (chronic) problems with pain. If there is any problem keeping an appointment, call to reschedule. °SEEK MEDICAL CARE IF:  °· Your chest pain does not go away, even after treatment. °· You have a rash with blisters on your chest. °· You have a fever. °SEEK IMMEDIATE MEDICAL CARE IF:  °· You have increased chest pain or pain that spreads to your arm, neck, jaw, back, or abdomen. °· You have shortness of breath. °· You have an increasing cough, or you cough   up blood. °· You have severe back or abdominal pain. °· You feel nauseous or vomit. °· You have severe weakness. °· You faint. °· You have chills. °This is an emergency. Do not wait to see if the pain will go away. Get medical help at once. Call your local emergency services (911 in [place] Do not drive  yourself to the hospital. °MAKE SURE YOU:  °· Understand these instructions. °· Will watch your condition. °· Will get help right away if you are not doing well or get worse. °Document Released: 10/26/2004 Document Revised: 01/21/2013 Document Reviewed: 08/22/2007 °ExitCare® Patient Information ©2015 [employer]. This information is not intended to replace advice given to you by your health care provider. Make sure you discuss any questions you have with your health care provider. ° °Hypertension °Hypertension, commonly called high blood pressure, is when the force of blood pumping through your arteries is too strong. Your arteries are the blood vessels that carry blood from your heart throughout your body. A blood pressure reading consists of a higher number over a lower number, such as 110/72. The higher number (systolic) is the pressure inside your arteries when your heart pumps. The lower number (diastolic) is the pressure inside your arteries when your heart relaxes. Ideally you want your blood pressure below 120/80. °Hypertension forces your heart to work harder to pump blood. Your arteries may become narrow or stiff. Having hypertension puts you at risk for heart disease, stroke, and other problems.  °RISK FACTORS °Some risk factors for high blood pressure are controllable. Others are not.  °Risk factors you cannot control include:  °· Race. You may be at higher risk if you are African American. °· Age. Risk increases with age. °· Gender. Men are at higher risk than women before age 45 years. After age 65, women are at higher risk than men. °Risk factors you can control include: °· Not getting enough exercise or physical activity. °· Being overweight. °· Getting too much fat, sugar, calories, or salt in your diet. °· Drinking too much alcohol. °SIGNS AND SYMPTOMS °Hypertension does not usually cause signs or symptoms. Extremely high blood pressure (hypertensive crisis) may cause headache, anxiety, shortness  of breath, and nosebleed. °DIAGNOSIS  °To check if you have hypertension, your health care provider will measure your blood pressure while you are seated, with your arm held at the level of your heart. It should be measured at least twice using the same arm. Certain conditions can cause a difference in blood pressure between your right and left arms. A blood pressure reading that is higher than normal on one occasion does not mean that you need treatment. If one blood pressure reading is high, ask your health care provider about having it checked again. °TREATMENT  °Treating high blood pressure includes making lifestyle changes and possibly taking medicine. Living a healthy lifestyle can help lower high blood pressure. You may need to change some of your habits. °Lifestyle changes may include: °· Following the DASH diet. This diet is high in fruits, vegetables, and whole grains. It is low in salt, red meat, and added sugars. °· Getting at least 2½ hours of brisk physical activity every week. °· Losing weight if necessary. °· Not smoking. °· Limiting alcoholic beverages. °· Learning ways to reduce stress. ° If lifestyle changes are not enough to get your blood pressure under control, your health care provider may prescribe medicine. You may need to take more than one. Work closely with your health care provider   to understand the risks and benefits. °HOME CARE INSTRUCTIONS °· Have your blood pressure rechecked as directed by your health care provider.   °· Take medicines only as directed by your health care provider. Follow the directions carefully. Blood pressure medicines must be taken as prescribed. The medicine does not work as well when you skip doses. Skipping doses also puts you at risk for problems.   °· Do not smoke.   °· Monitor your blood pressure at home as directed by your health care provider.  °SEEK MEDICAL CARE IF:  °· You think you are having a reaction to medicines taken. °· You have recurrent  headaches or feel dizzy. °· You have swelling in your ankles. °· You have trouble with your vision. °SEEK IMMEDIATE MEDICAL CARE IF: °· You develop a severe headache or confusion. °· You have unusual weakness, numbness, or feel faint. °· You have severe chest or abdominal pain. °· You vomit repeatedly. °· You have trouble breathing. °MAKE SURE YOU:  °· Understand these instructions. °· Will watch your condition. °· Will get help right away if you are not doing well or get worse. °Document Released: 01/16/2005 Document Revised: 06/02/2013 Document Reviewed: 11/08/2012 °ExitCare® Patient Information ©2015 [employer]. This information is not intended to replace advice given to you by your health care provider. Make sure you discuss any questions you have with your health care provider. ° °

## 2014-06-12 ENCOUNTER — Ambulatory Visit: Payer: Medicaid Other

## 2014-11-27 ENCOUNTER — Encounter (HOSPITAL_COMMUNITY): Payer: Self-pay | Admitting: *Deleted

## 2014-11-27 ENCOUNTER — Emergency Department (HOSPITAL_COMMUNITY)
Admission: EM | Admit: 2014-11-27 | Discharge: 2014-11-27 | Disposition: A | Discharge status: Home / Self Care | Payer: Medicaid Other | Attending: Emergency Medicine | Admitting: Emergency Medicine

## 2014-11-27 DIAGNOSIS — J45909 Unspecified asthma, uncomplicated: Secondary | ICD-10-CM | POA: Insufficient documentation

## 2014-11-27 DIAGNOSIS — Z7951 Long term (current) use of inhaled steroids: Secondary | ICD-10-CM | POA: Insufficient documentation

## 2014-11-27 DIAGNOSIS — Z91040 Latex allergy status: Secondary | ICD-10-CM | POA: Insufficient documentation

## 2014-11-27 DIAGNOSIS — I10 Essential (primary) hypertension: Secondary | ICD-10-CM | POA: Insufficient documentation

## 2014-11-27 DIAGNOSIS — D649 Anemia, unspecified: Secondary | ICD-10-CM | POA: Insufficient documentation

## 2014-11-27 DIAGNOSIS — Z79899 Other long term (current) drug therapy: Secondary | ICD-10-CM | POA: Insufficient documentation

## 2014-11-27 DIAGNOSIS — H109 Unspecified conjunctivitis: Secondary | ICD-10-CM | POA: Insufficient documentation

## 2014-11-27 MED ORDER — GENTAMICIN SULFATE 0.3 % OP SOLN: 2.0000 [drp] | OPHTHALMIC | Status: DC

## 2014-11-27 NOTE — ED Notes (Signed)
The pts lt eye was jumping today and her eye is throbbing

## 2014-11-27 NOTE — ED Provider Notes (Signed)
CSN: [id number]     Arrival date & time 11/27/14  1953 History   By signing my name below, I, Gonzella Lex, attest that this documentation has been prepared under the direction and in the presence of Arthor Captain, PA-C Electronically Signed: Gonzella Lex, Scribe. 11/27/2014. 8:52 PM.  Chief Complaint  Patient presents with  . Eye Problem    The history is provided by the patient. No language interpreter was used.    HPI Comments: Brenda Mcintyre is a 39 y.o. female who presents to the Emergency Department complaining of left eye twitching which pt describes as a "jumping" sensation earlier today. Twitching resolved and between 1:00 and 2:00 PM pt reports eye discomfort and draining of left eye between 5 and 5:30 PM.  Pt also complains of cloudiness in left eye as well as erythema and mild discomfort with eye movement. Pt has no acute photophobia. She has applied a cool and warm compress to left eye with little reileif. Pt denies ever wearing contact lenses but wears glasses.  Past Medical History  Diagnosis Date  . Asthma   . Hypertension   . Anemia    Past Surgical History  Procedure Laterality Date  . Cesarean section    . Vaginal birth after cesarean section     Family History  Problem Relation Age of Onset  . Diabetes Other    Social History  Substance Use Topics  . Smoking status: Never Smoker   . Smokeless tobacco: None  . Alcohol Use: Yes     Comment: occ   OB History    No data available     Review of Systems  Constitutional: Negative for fever and chills.  Eyes: Positive for pain, discharge, redness and visual disturbance ( described as cloudiness).       Throbbing Twitching    Allergies  Sulfa antibiotics and Latex  Home Medications   Prior to Admission medications   Medication Sig Start Date End Date Taking? Authorizing Provider  albuterol (PROVENTIL HFA;VENTOLIN HFA) 108 (90 BASE) MCG/ACT inhaler Inhale 2 puffs into the lungs  every 6 (six) hours as needed for wheezing or shortness of breath.     Historical Provider, MD  cetirizine (ZYRTEC) 10 MG tablet Take 10 mg by mouth daily as needed for allergies.    Historical Provider, MD  diphenhydrAMINE (BENADRYL) 25 MG tablet Take 1 tablet (25 mg total) by mouth every 6 (six) hours. Patient taking differently: Take 25 mg by mouth every 6 (six) hours as needed for itching or allergies.  12/05/12   Fayrene Helper, PA-C  diphenhydrAMINE (SOMINEX) 25 MG tablet Take 100 mg by mouth at bedtime as needed for allergies (allergic reaction).    Historical Provider, MD  famotidine (PEPCID) 20 MG tablet Take 1 tablet (20 mg total) by mouth 2 (two) times daily. Patient not taking: Reported on 05/17/2014 12/05/12   Fayrene Helper, PA-C  Fluticasone-Salmeterol (ADVAIR DISKUS IN) Inhale 2 puffs into the lungs daily.    Historical Provider, MD  Iron TABS Take 2 tablets by mouth daily.    Historical Provider, MD  losartan (COZAAR) 50 MG tablet Take 1 tablet (50 mg total) by mouth daily. 05/18/14   [name], MD  Multiple Vitamin (MULTIVITAMIN WITH MINERALS) TABS tablet Take 2 tablets by mouth daily.    Historical Provider, MD  predniSONE (DELTASONE) 10 MG tablet Take 2 tablets (20 mg total) by mouth daily. Patient not taking: Reported on 05/17/2014 12/05/12   Greta Doom  Laveda Normanran, PA-C   BP 177/92 mmHg  Pulse 75  Temp(Src) 98.5 F (36.9 C) (Oral)  Resp 16  Ht 5\' 7"  (1.702 m)  Wt 247 lb 4 oz (112.152 kg)  BMI 38.72 kg/m2  SpO2 100%  LMP 10/28/2014 Physical Exam  Constitutional: She is oriented to person, place, and time. She appears well-developed and well-nourished. No distress.  HENT:  Head: Normocephalic.  Eyes: Conjunctivae are normal.  Chemosis of the left eye Injection of the conjunctiva on lateral left eye  No drainage   Cardiovascular: Normal rate.   Pulmonary/Chest: Effort normal.  Abdominal: She exhibits no distension.  Neurological: She is alert and oriented to person, place, and  time.  Skin: Skin is warm and dry.  Psychiatric: She has a normal mood and affect.  Nursing note and vitals reviewed.   ED Course  Procedures  DIAGNOSTIC STUDIES:    Oxygen Saturation is 100% on room air, normal by my interpretation.   COORDINATION OF CARE:  8:08 PM Will perform visual acuity test.  Discussed treatment plan with pt at bedside and pt agreed to plan.    Labs Review Labs Reviewed - No data to display  Imaging Review No results found.    EKG Interpretation None      MDM   Final diagnoses:  None      Patient presentation consistent with conjunctivitis.   Presentation non-concerning for iritis, bacterial conjunctivitis, corneal abrasions, or HSV.  No antibiotics are indicated and patient will be prescribed tobramycin drops.  Personal hygiene and frequent handwashing discussed.  Patient advised to followup with ophthalmologist if symptoms persist or worsen in any way including vision change or purulent discharge.  Patient verbalizes understanding and is agreeable with discharge.   I personally performed the services described in this documentation, which was scribed in my presence. The recorded information has been reviewed and is accurate.       Arthor CaptainAbigail Yaa Donnellan, PA-C 11/28/14 [id number]  Mirian MoMatthew Gentry, MD 12/03/14 1500

## 2014-11-27 NOTE — Discharge Instructions (Signed)
Hypertension Hypertension, commonly called high blood pressure, is when the force of blood pumping through your arteries is too strong. Your arteries are the blood vessels that carry blood from your heart throughout your body. A blood pressure reading consists of a higher number over a lower number, such as 110/72. The higher number (systolic) is the pressure inside your arteries when your heart pumps. The lower number (diastolic) is the pressure inside your arteries when your heart relaxes. Ideally you want your blood pressure below 120/80. Hypertension forces your heart to work harder to pump blood. Your arteries may become narrow or stiff. Having untreated or uncontrolled hypertension can cause heart attack, stroke, kidney disease, and other problems. RISK FACTORS Some risk factors for high blood pressure are controllable. Others are not.  Risk factors you cannot control include:   Race. You may be at higher risk if you are African American.  Age. Risk increases with age.  Gender. Men are at higher risk than women before age 45 years. After age 65, women are at higher risk than men. Risk factors you can control include:  Not getting enough exercise or physical activity.  Being overweight.  Getting too much fat, sugar, calories, or salt in your diet.  Drinking too much alcohol. SIGNS AND SYMPTOMS Hypertension does not usually cause signs or symptoms. Extremely high blood pressure (hypertensive crisis) may cause headache, anxiety, shortness of breath, and nosebleed. DIAGNOSIS To check if you have hypertension, your health care provider will measure your blood pressure while you are seated, with your arm held at the level of your heart. It should be measured at least twice using the same arm. Certain conditions can cause a difference in blood pressure between your right and left arms. A blood pressure reading that is higher than normal on one occasion does not mean that you need treatment. If  it is not clear whether you have high blood pressure, you may be asked to return on a different day to have your blood pressure checked again. Or, you may be asked to monitor your blood pressure at home for 1 or more weeks. TREATMENT Treating high blood pressure includes making lifestyle changes and possibly taking medicine. Living a healthy lifestyle can help lower high blood pressure. You may need to change some of your habits. Lifestyle changes may include:  Following the DASH diet. This diet is high in fruits, vegetables, and whole grains. It is low in salt, red meat, and added sugars.  Keep your sodium intake below 2,300 mg per day.  Getting at least 30-45 minutes of aerobic exercise at least 4 times per week.  Losing weight if necessary.  Not smoking.  Limiting alcoholic beverages.  Learning ways to reduce stress. Your health care provider may prescribe medicine if lifestyle changes are not enough to get your blood pressure under control, and if one of the following is true:  You are 18-59 years of age and your systolic blood pressure is above 140.  You are 60 years of age or older, and your systolic blood pressure is above 150.  Your diastolic blood pressure is above 90.  You have diabetes, and your systolic blood pressure is over 140 or your diastolic blood pressure is over 90.  You have kidney disease and your blood pressure is above 140/90.  You have heart disease and your blood pressure is above 140/90. Your personal target blood pressure may vary depending on your medical conditions, your age, and other factors. HOME CARE INSTRUCTIONS    Have your blood pressure rechecked as directed by your health care provider.   Take medicines only as directed by your health care provider. Follow the directions carefully. Blood pressure medicines must be taken as prescribed. The medicine does not work as well when you skip doses. Skipping doses also puts you at risk for  problems.  Do not smoke.   Monitor your blood pressure at home as directed by your health care provider. SEEK MEDICAL CARE IF:   You think you are having a reaction to medicines taken.  You have recurrent headaches or feel dizzy.  You have swelling in your ankles.  You have trouble with your vision. SEEK IMMEDIATE MEDICAL CARE IF:  You develop a severe headache or confusion.  You have unusual weakness, numbness, or feel faint.  You have severe chest or abdominal pain.  You vomit repeatedly.  You have trouble breathing. MAKE SURE YOU:   Understand these instructions.  Will watch your condition.  Will get help right away if you are not doing well or get worse.   This information is not intended to replace advice given to you by your health care provider. Make sure you discuss any questions you have with your health care provider.   Document Released: 01/16/2005 Document Revised: 06/02/2014 Document Reviewed: 11/08/2012 Elsevier Interactive Patient Education 2016 [employer]  

## 2015-11-23 ENCOUNTER — Emergency Department (HOSPITAL_COMMUNITY): Payer: Medicaid Other

## 2015-11-23 ENCOUNTER — Encounter (HOSPITAL_COMMUNITY): Payer: Self-pay

## 2015-11-23 DIAGNOSIS — I10 Essential (primary) hypertension: Secondary | ICD-10-CM | POA: Insufficient documentation

## 2015-11-23 DIAGNOSIS — J45909 Unspecified asthma, uncomplicated: Secondary | ICD-10-CM | POA: Insufficient documentation

## 2015-11-23 DIAGNOSIS — Z91040 Latex allergy status: Secondary | ICD-10-CM | POA: Insufficient documentation

## 2015-11-23 DIAGNOSIS — R0789 Other chest pain: Secondary | ICD-10-CM | POA: Insufficient documentation

## 2015-11-23 LAB — CBC
HCT: 29 % — ABNORMAL LOW (ref 36.0–46.0)
Hemoglobin: 8.2 g/dL — ABNORMAL LOW (ref 12.0–15.0)
MCH: 18.4 pg — AB (ref 26.0–34.0)
MCHC: 28.3 g/dL — ABNORMAL LOW (ref 30.0–36.0)
MCV: 65 fL — AB (ref 78.0–100.0)
Platelets: 161 10*3/uL (ref 150–400)
RBC: 4.46 MIL/uL (ref 3.87–5.11)
RDW: 17.8 % — AB (ref 11.5–15.5)
WBC: 7.7 10*3/uL (ref 4.0–10.5)

## 2015-11-23 LAB — BASIC METABOLIC PANEL
Anion gap: 5 (ref 5–15)
BUN: 14 mg/dL (ref 6–20)
CHLORIDE: 107 mmol/L (ref 101–111)
CO2: 28 mmol/L (ref 22–32)
CREATININE: 0.85 mg/dL (ref 0.44–1.00)
Calcium: 9 mg/dL (ref 8.9–10.3)
GFR calc Af Amer: >60 mL/min (ref >60)
GFR calc non Af Amer: >60 mL/min (ref >60)
Glucose, Bld: 103 mg/dL — ABNORMAL HIGH (ref 65–99)
Potassium: 4 mmol/L (ref 3.5–5.1)
SODIUM: 140 mmol/L (ref 135–145)

## 2015-11-23 LAB — I-STAT TROPONIN, ED: TROPONIN I, POC: 0 ng/mL (ref 0.00–0.08)

## 2015-11-23 NOTE — ED Triage Notes (Signed)
Pt has not taken BP med in a while, ran out of med.  No PCP

## 2015-11-23 NOTE — ED Triage Notes (Signed)
Onset 2-3 weeks sharp pains in chest several times a week and having pressure pain in center of chest with shortness of breath.  Respirations even and unlabored.

## 2015-11-24 ENCOUNTER — Emergency Department (HOSPITAL_COMMUNITY)
Admission: EM | Admit: 2015-11-24 | Discharge: 2015-11-24 | Disposition: A | Discharge status: Home / Self Care | Payer: Medicaid Other | Attending: Emergency Medicine | Admitting: Emergency Medicine

## 2015-11-24 DIAGNOSIS — I10 Essential (primary) hypertension: Secondary | ICD-10-CM

## 2015-11-24 DIAGNOSIS — R079 Chest pain, unspecified: Secondary | ICD-10-CM

## 2015-11-24 LAB — I-STAT TROPONIN, ED: TROPONIN I, POC: 0 ng/mL (ref 0.00–0.08)

## 2015-11-24 MED ORDER — LOSARTAN POTASSIUM 50 MG PO TABS: 50.0000 mg | ORAL_TABLET | Freq: Every day | ORAL | 0 refills | Status: DC

## 2015-11-24 MED ORDER — HYDROCHLOROTHIAZIDE 25 MG PO TABS
25.0000 mg | ORAL_TABLET | Freq: Every day | ORAL | Status: DC
  Administered 2015-11-24: 25 mg via ORAL
  Filled 2015-11-24: qty 1

## 2015-11-24 MED ORDER — HYDROCHLOROTHIAZIDE 25 MG PO TABS: 25.0000 mg | ORAL_TABLET | Freq: Every day | ORAL | 0 refills | Status: DC

## 2015-11-24 NOTE — ED Provider Notes (Signed)
[facility]-EMERGENCY DEPT Provider Note   CSN: [id number] Arrival date & time: 11/23/15  2250  By signing my name below, I, Brenda Mcintyre, attest that this documentation has been prepared under the direction and in the presence of [name], MD  Electronically Signed: Clovis Mcintyre, ED Scribe. 11/24/15. 2:59 AM.   History   Chief Complaint Chief Complaint  Patient presents with  . Chest Pain   The history is provided by the patient. No language interpreter was used.   HPI Comments:  Brenda Mcintyre is a 40 y.o. female, with a hx of HTN and asthma, who presents to the Emergency Department complaining of "sharp" chest pain x 3 weeks. She states her pain intermittently radiates to her head. Associated symptoms include SOB and chest tightness. Pt states she has run out of her blood pressure medication and has not been able to take medication. She denies leg swelling, fevers, nausea, vomiting, abdominal pain and hx of blood clots. She denies current pain. No alleviating factors noted. Pt denies any other complaints at this time. Pt does not have PCP.  Past Medical History:  Diagnosis Date  . Anemia   . Asthma   . Hypertension     Patient Active Problem List   Diagnosis Date Noted  . THROMBOCYTOPENIA 03/29/2006  . HYPERTENSION, BENIGN SYSTEMIC 03/29/2006  . ASTHMA, EXERCISE INDUCED 03/29/2006  . ECZEMA, ATOPIC DERMATITIS 03/29/2006    Past Surgical History:  Procedure Laterality Date  . CESAREAN SECTION    . VAGINAL BIRTH AFTER CESAREAN SECTION      OB History    No data available       Home Medications    Prior to Admission medications   Medication Sig Start Date End Date Taking? Authorizing Provider  diphenhydrAMINE (BENADRYL) 25 MG tablet Take 1 tablet (25 mg total) by mouth every 6 (six) hours. Patient not taking: Reported on 11/24/2015 12/05/12   Fayrene Helper, PA-C  gentamicin (GARAMYCIN) 0.3 % ophthalmic solution Place 2 drops into both eyes every 4 (four)  hours. Patient not taking: Reported on 11/24/2015 11/27/14   Arthor Captain, PA-C  hydrochlorothiazide (HYDRODIURIL) 25 MG tablet Take 1 tablet (25 mg total) by mouth daily. 11/24/15   [name], MD  losartan (COZAAR) 50 MG tablet Take 1 tablet (50 mg total) by mouth daily. Patient not taking: Reported on 11/24/2015 05/18/14   [name], MD  losartan (COZAAR) 50 MG tablet Take 1 tablet (50 mg total) by mouth daily. 11/24/15   [name], MD    Family History Family History  Problem Relation Age of Onset  . Diabetes Other     Social History Social History  Substance Use Topics  . Smoking status: Never Smoker  . Smokeless tobacco: Not on file  . Alcohol use Yes     Comment: occ     Allergies   Sulfa antibiotics and Latex   Review of Systems Review of Systems  Constitutional: Negative for fever.  Respiratory: Positive for chest tightness and shortness of breath.   Cardiovascular: Positive for chest pain. Negative for leg swelling.  Gastrointestinal: Negative for abdominal pain, nausea and vomiting.  All other systems reviewed and are negative.    Physical Exam Updated Vital Signs BP 159/86 (BP Location: Right Arm)   Pulse 64   Temp 98.8 F (37.1 C) (Oral)   Resp 18   Ht 5\' 7"  (1.702 m)   Wt 258 lb 6.4 oz (117.2 kg)   LMP 10/30/2015  SpO2 97%   BMI 40.47 kg/m   Physical Exam  Constitutional: She is oriented to person, place, and time. She appears well-developed and well-nourished. No distress.  Overweight  HENT:  Head: Normocephalic and atraumatic.  Cardiovascular: Normal rate, regular rhythm and normal heart sounds.   No murmur heard. Pulmonary/Chest: Effort normal. No respiratory distress. She has no wheezes.  Abdominal: Soft. Bowel sounds are normal. There is no tenderness. There is no guarding.  Musculoskeletal:  Trace bilateral lower extremity edema  Neurological: She is alert and oriented to person, place, and time.  Skin:  Skin is warm and dry.  Psychiatric: She has a normal mood and affect.  Nursing note and vitals reviewed.    ED Treatments / Results  DIAGNOSTIC STUDIES:  Oxygen Saturation is 100% on RA, normal by my interpretation.    COORDINATION OF CARE:  2:49 AM Discussed treatment plan with pt at bedside and pt agreed to plan.  Labs (all labs ordered are listed, but only abnormal results are displayed) Labs Reviewed  BASIC METABOLIC PANEL - Abnormal; Notable for the following:       Result Value   Glucose, Bld 103 (*)    All other components within normal limits  CBC - Abnormal; Notable for the following:    Hemoglobin 8.2 (*)    HCT 29.0 (*)    MCV 65.0 (*)    MCH 18.4 (*)    MCHC 28.3 (*)    RDW 17.8 (*)    All other components within normal limits  I-STAT TROPOININ, ED  I-STAT TROPOININ, ED    EKG  EKG Interpretation  Date/Time:  Tuesday November 23 2015 22:58:33 EDT Ventricular Rate:  87 PR Interval:  152 QRS Duration: 82 QT Interval:  396 QTC Calculation: 476 R Axis:   65 Text Interpretation:  Normal sinus rhythm Normal ECG Confirmed by Wilkie Aye  MD, Elmar Antigua ([id number]) on 11/24/2015 2:59:36 AM       Radiology Dg Chest 2 View  Result Date: 11/23/2015 CLINICAL DATA:  Chest pain off and on for 2 weeks EXAM: CHEST  2 VIEW COMPARISON:  05/18/2014 FINDINGS: The heart size and mediastinal contours are within normal limits. Both lungs are clear. The visualized skeletal structures are unremarkable. IMPRESSION: No active cardiopulmonary disease. Electronically Signed   By: Jasmine Pang M.D.   On: 11/23/2015 23:55    Procedures Procedures (including critical care time)  Medications Ordered in ED Medications  hydrochlorothiazide (HYDRODIURIL) tablet 25 mg (25 mg Oral Given 11/24/15 0311)     Initial Impression / Assessment and Plan / ED Course  I have reviewed the triage vital signs and the nursing notes.  Pertinent labs & imaging results that were available during my  care of the patient were reviewed by me and considered in my medical decision making (see chart for details).  Clinical Course    Patient presents with chest pain. Somewhat atypical in nature. Ongoing for the last 3 weeks. She was noted to be hypotensive 160s over 90s. Otherwise vital signs reassuring. Currently symptom-free. Workup including EKG, troponin 2, chest x-ray reassuring.  Heart score 1. Low-risk PE and PERC negative.  Patient does not have a primary physician and is requesting [facility] follow-up. Will discharge with HCTZ and losartan. Follow-up information provided. She was also given follow-up information for cardiology given her persistent hypertension and ongoing chest pain. Encouraged follow-up with cardiology for evaluation and possible stress testing.  After history, exam, and medical workup I feel the patient has  been appropriately medically screened and is safe for discharge home. Pertinent diagnoses were discussed with the patient. Patient was given return precautions.   Final Clinical Impressions(s) / ED Diagnoses   Final diagnoses:  Nonspecific chest pain  Essential hypertension    New Prescriptions Discharge Medication List as of 11/24/2015  4:30 AM    START taking these medications   Details  hydrochlorothiazide (HYDRODIURIL) 25 MG tablet Take 1 tablet (25 mg total) by mouth daily., Starting Wed 11/24/2015, Print    !! losartan (COZAAR) 50 MG tablet Take 1 tablet (50 mg total) by mouth daily., Starting Wed 11/24/2015, Print     !! - Potential duplicate medications found. Please discuss with provider.     I personally performed the services described in this documentation, which was scribed in my presence. The recorded information has been reviewed and is accurate.     Shon Batonourtney F Niels Cranshaw, MD 11/24/15 [phone]

## 2016-05-30 ENCOUNTER — Ambulatory Visit (INDEPENDENT_AMBULATORY_CARE_PROVIDER_SITE_OTHER): Payer: Medicaid Other | Admitting: Physician Assistant

## 2016-06-13 ENCOUNTER — Ambulatory Visit (INDEPENDENT_AMBULATORY_CARE_PROVIDER_SITE_OTHER): Payer: Medicaid Other | Admitting: Physician Assistant

## 2016-06-29 ENCOUNTER — Ambulatory Visit (INDEPENDENT_AMBULATORY_CARE_PROVIDER_SITE_OTHER): Payer: Medicaid Other | Admitting: Physician Assistant

## 2016-07-31 ENCOUNTER — Ambulatory Visit (INDEPENDENT_AMBULATORY_CARE_PROVIDER_SITE_OTHER): Payer: Self-pay | Admitting: Physician Assistant

## 2016-07-31 ENCOUNTER — Encounter (INDEPENDENT_AMBULATORY_CARE_PROVIDER_SITE_OTHER): Payer: Self-pay | Admitting: Physician Assistant

## 2016-07-31 VITALS — BP 168/96 | HR 69 | Temp 98.0°F | Ht 68.0 in | Wt 247.6 lb

## 2016-07-31 DIAGNOSIS — R109 Unspecified abdominal pain: Secondary | ICD-10-CM

## 2016-07-31 DIAGNOSIS — D696 Thrombocytopenia, unspecified: Secondary | ICD-10-CM

## 2016-07-31 DIAGNOSIS — Z131 Encounter for screening for diabetes mellitus: Secondary | ICD-10-CM

## 2016-07-31 DIAGNOSIS — R5383 Other fatigue: Secondary | ICD-10-CM

## 2016-07-31 DIAGNOSIS — I10 Essential (primary) hypertension: Secondary | ICD-10-CM

## 2016-07-31 DIAGNOSIS — J45990 Exercise induced bronchospasm: Secondary | ICD-10-CM

## 2016-07-31 LAB — POCT URINALYSIS DIPSTICK
Bilirubin, UA: NEGATIVE
GLUCOSE UA: NEGATIVE
Ketones, UA: NEGATIVE
LEUKOCYTES UA: NEGATIVE
Nitrite, UA: NEGATIVE
PROTEIN UA: NEGATIVE
Spec Grav, UA: 1.02 (ref 1.010–1.025)
UROBILINOGEN UA: 0.2 U/dL
pH, UA: 6 (ref 5.0–8.0)

## 2016-07-31 LAB — POCT GLYCOSYLATED HEMOGLOBIN (HGB A1C): Hemoglobin A1C: 5.4

## 2016-07-31 MED ORDER — HYDROCHLOROTHIAZIDE 25 MG PO TABS: 25.0000 mg | ORAL_TABLET | Freq: Every day | ORAL | 0 refills | Status: DC

## 2016-07-31 MED ORDER — LOSARTAN POTASSIUM 50 MG PO TABS: 50.0000 mg | ORAL_TABLET | Freq: Every day | ORAL | 0 refills | Status: DC

## 2016-07-31 MED ORDER — LOSARTAN POTASSIUM 50 MG PO TABS: 50.0000 mg | ORAL_TABLET | Freq: Every day | ORAL | 5 refills | Status: DC

## 2016-07-31 MED ORDER — HYDROCHLOROTHIAZIDE 25 MG PO TABS: 25.0000 mg | ORAL_TABLET | Freq: Every day | ORAL | 5 refills | Status: DC

## 2016-07-31 MED ORDER — ALBUTEROL SULFATE HFA 108 (90 BASE) MCG/ACT IN AERS: 2.0000 | INHALATION_SPRAY | RESPIRATORY_TRACT | 5 refills | Status: DC | PRN

## 2016-07-31 NOTE — Progress Notes (Signed)
Subjective:  Patient ID: Brenda Mcintyre, female    DOB: 03-Apr-1975  Age: 41 y.o. MRN: [id number]  CC: establish care  HPI Brenda Mcintyre is a 41 y.o. female with a PMH of thrombocytopenia, anemia, asthma, and HTN presents as a new patient. Has not been taking medication for HTN as she has run out. Was taking HCTZ and Losartan but never concurrently. Does not think either one helped control her BP when taken as monotherapy. Would also like a prescription for exercise induced asthma. No current cough, chest tightness, SOB, or wheezing. Complains of fatigue intermittently and is attributed to anemia. Does not have a heavy menstrual period or have a hx of renal disease.     Complains of past hx of thrombocytopenia. Says she was sent to cancer clinic in 2001 and there was never an answer to why her platelets were so low. Is interested in knowing if her platelets continue to be low.     Lastly, complains of intermittent left lower flank pain. Has noticed that urine is sometimes cloudy with what appears to be white sediment. No urinary symptoms currently but she was concerned as to what pain and sediment may have been.      Outpatient Medications Prior to Visit  Medication Sig Dispense Refill  . diphenhydrAMINE (BENADRYL) 25 MG tablet Take 1 tablet (25 mg total) by mouth every 6 (six) hours. (Patient not taking: Reported on 11/24/2015) 20 tablet 0  . gentamicin (GARAMYCIN) 0.3 % ophthalmic solution Place 2 drops into both eyes every 4 (four) hours. (Patient not taking: Reported on 11/24/2015) 5 mL 0  . hydrochlorothiazide (HYDRODIURIL) 25 MG tablet Take 1 tablet (25 mg total) by mouth daily. (Patient not taking: Reported on 07/31/2016) 30 tablet 0  . losartan (COZAAR) 50 MG tablet Take 1 tablet (50 mg total) by mouth daily. (Patient not taking: Reported on 11/24/2015) 30 tablet 3  . losartan (COZAAR) 50 MG tablet Take 1 tablet (50 mg total) by mouth daily. (Patient not taking: Reported on  07/31/2016) 30 tablet 0   No facility-administered medications prior to visit.      ROS Review of Systems  Constitutional: Positive for malaise/fatigue. Negative for chills and fever.  Eyes: Negative for blurred vision.  Respiratory: Positive for shortness of breath (exercise induced asthma).   Cardiovascular: Negative for chest pain and palpitations.  Gastrointestinal: Negative for abdominal pain and nausea.  Genitourinary: Negative for dysuria and hematuria.       White urinary sediment previously, no symptoms now  Musculoskeletal: Negative for joint pain and myalgias.       Lower left flank pain, not currently  Skin: Negative for rash.  Neurological: Negative for tingling and headaches.  Endo/Heme/Allergies:       Low platelets  Psychiatric/Behavioral: Negative for depression. The patient is not nervous/anxious.     Objective:  BP (!) 168/96 (BP Location: Left Arm, Patient Position: Sitting, Cuff Size: Large)   Pulse 69   Temp 98 F (36.7 C) (Oral)   Ht 5\' 8"  (1.727 m)   Wt 247 lb 9.6 oz (112.3 kg)   LMP 07/27/2016 (Exact Date)   SpO2 100%   BMI 37.65 kg/m   BP/Weight 07/31/2016 11/24/2015 11/23/2015  Systolic BP 168 159 -  Diastolic BP 96 86 -  Wt. (Lbs) 247.6 - 258.4  BMI 37.65 40.47 -      Physical Exam  Constitutional: She is oriented to person, place, and time.  Well developed, obese, NAD, polite  HENT:  Head: Normocephalic and atraumatic.  Eyes: No scleral icterus.  Neck: Normal range of motion. Neck supple. No thyromegaly present.  Cardiovascular: Normal rate, regular rhythm and normal heart sounds.   Pulmonary/Chest: Effort normal and breath sounds normal.  Abdominal: Soft. Bowel sounds are normal. She exhibits no distension and no mass. There is no tenderness.  No hepatosplenomegaly   Musculoskeletal: She exhibits no edema.  Neurological: She is alert and oriented to person, place, and time. No cranial nerve deficit. Coordination normal.  Skin: Skin  is warm and dry. No rash noted. No erythema. No pallor.  Psychiatric: She has a normal mood and affect. Her behavior is normal. Thought content normal.  Vitals reviewed.    Assessment & Plan:   1. Hypertension, unspecified type - CBC with Differential - Comprehensive metabolic panel - Begin losartan (COZAAR) 50 MG tablet; Take 1 tablet (50 mg total) by mouth daily.  Dispense: 30 tablet; Refill: 5 - Begin hydrochlorothiazide (HYDRODIURIL) 25 MG tablet; Take 1 tablet (25 mg total) by mouth daily.  Dispense: 30 tablet; Refill: 5  2. ASTHMA, EXERCISE INDUCED - Begin albuterol (PROVENTIL HFA;VENTOLIN HFA) 108 (90 Base) MCG/ACT inhaler; Inhale 2 puffs into the lungs every 4 (four) hours as needed for wheezing or shortness of breath.  Dispense: 1 Inhaler; Refill: 5  3. THROMBOCYTOPENIA - CBC with Differential  4. Fatigue, unspecified type - CBC with Differential - TSH  5. Screening for diabetes mellitus - HgB A1c 5.4% in clinic today  6. Flank pain - Urinalysis Dipstick    Follow-up: Return in about 4 weeks (around 08/28/2016) for HTN.   Loletta Specteroger David Gomez PA

## 2016-07-31 NOTE — Patient Instructions (Signed)

## 2016-08-01 ENCOUNTER — Other Ambulatory Visit (INDEPENDENT_AMBULATORY_CARE_PROVIDER_SITE_OTHER): Payer: Self-pay | Admitting: Physician Assistant

## 2016-08-01 DIAGNOSIS — D649 Anemia, unspecified: Secondary | ICD-10-CM

## 2016-08-01 LAB — COMPREHENSIVE METABOLIC PANEL
A/G RATIO: 1.3 (ref 1.2–2.2)
ALT: 32 IU/L (ref 0–32)
AST: 35 IU/L (ref 0–40)
Albumin: 4.3 g/dL (ref 3.5–5.5)
Alkaline Phosphatase: 80 IU/L (ref 39–117)
BUN / CREAT RATIO: 11 (ref 9–23)
BUN: 8 mg/dL (ref 6–20)
Bilirubin Total: 0.2 mg/dL (ref 0.0–1.2)
CO2: 25 mmol/L (ref 20–29)
Calcium: 9 mg/dL (ref 8.7–10.2)
Chloride: 104 mmol/L (ref 96–106)
Creatinine, Ser: 0.75 mg/dL (ref 0.57–1.00)
GFR calc non Af Amer: 101 mL/min/{1.73_m2} (ref >59)
GFR, EST AFRICAN AMERICAN: 116 mL/min/{1.73_m2} (ref >59)
GLOBULIN, TOTAL: 3.2 g/dL (ref 1.5–4.5)
Glucose: 95 mg/dL (ref 65–99)
POTASSIUM: 4.3 mmol/L (ref 3.5–5.2)
Sodium: 141 mmol/L (ref 134–144)
TOTAL PROTEIN: 7.5 g/dL (ref 6.0–8.5)

## 2016-08-01 LAB — CBC WITH DIFFERENTIAL/PLATELET
Basophils Absolute: 0 10*3/uL (ref 0.0–0.2)
Basos: 0 %
EOS (ABSOLUTE): 0.1 10*3/uL (ref 0.0–0.4)
Eos: 2 %
HEMATOCRIT: 29.1 % — AB (ref 34.0–46.6)
Hemoglobin: 8.1 g/dL — ABNORMAL LOW (ref 11.1–15.9)
IMMATURE GRANS (ABS): 0 10*3/uL (ref 0.0–0.1)
Immature Granulocytes: 0 %
Lymphocytes Absolute: 2.2 10*3/uL (ref 0.7–3.1)
Lymphs: 40 %
MCH: 17.7 pg — ABNORMAL LOW (ref 26.6–33.0)
MCHC: 27.8 g/dL — ABNORMAL LOW (ref 31.5–35.7)
MCV: 64 fL — ABNORMAL LOW (ref 79–97)
MONOS ABS: 0.5 10*3/uL (ref 0.1–0.9)
Monocytes: 9 %
Neutrophils Absolute: 2.7 10*3/uL (ref 1.4–7.0)
Neutrophils: 49 %
Platelets: 213 10*3/uL (ref 150–379)
RBC: 4.57 x10E6/uL (ref 3.77–5.28)
RDW: 17.7 % — AB (ref 12.3–15.4)
WBC: 5.4 10*3/uL (ref 3.4–10.8)

## 2016-08-01 LAB — TSH: TSH: 2.01 u[IU]/mL (ref 0.450–4.500)

## 2016-08-01 MED ORDER — DOCUSATE SODIUM 50 MG PO CAPS: 50.0000 mg | ORAL_CAPSULE | Freq: Two times a day (BID) | ORAL | 0 refills | Status: DC

## 2016-08-01 MED ORDER — FERROUS SULFATE 325 (65 FE) MG PO TABS: 325.0000 mg | ORAL_TABLET | Freq: Three times a day (TID) | ORAL | 1 refills | Status: DC

## 2016-08-10 ENCOUNTER — Telehealth: Payer: Self-pay | Admitting: Physician Assistant

## 2016-08-10 NOTE — Telephone Encounter (Signed)
-----   Message from Margaretmary LombardNubia K Mcintyre, [place] sent at 08/10/2016  3:46 PM EDT ----- Please inform patient of liver, thyroid and kidneys being normal. Patient is anemic so iron supplements were sent to the pharmacy.

## 2016-08-10 NOTE — Telephone Encounter (Signed)
Patient did not answer I left a message for her to call back for her test results at [phone]

## 2016-08-14 ENCOUNTER — Telehealth (INDEPENDENT_AMBULATORY_CARE_PROVIDER_SITE_OTHER): Payer: Self-pay | Admitting: Physician Assistant

## 2016-08-14 NOTE — Telephone Encounter (Signed)
Called patient and voicemail came on, left message for her to call office for lab results. Brenda Mcintyre, CMA

## 2016-08-14 NOTE — Telephone Encounter (Signed)
Patient called to request lab results °Please follow up °

## 2016-08-24 ENCOUNTER — Telehealth: Payer: Self-pay | Admitting: *Deleted

## 2016-08-24 NOTE — Telephone Encounter (Signed)
-----   Message from Loletta Specteroger David Gomez, PA-C sent at 08/01/2016  8:46 AM EDT ----- Normal thyroid, liver enzymes, and renal filtration. She is anemic and should take iron pills. I will send out rx for iron pills now.

## 2016-08-24 NOTE — Telephone Encounter (Signed)
Medical Assistant left message on patient's home and cell voicemail. Voicemail states to give a call back to [place] [facility] at [phone].

## 2016-08-31 ENCOUNTER — Encounter (INDEPENDENT_AMBULATORY_CARE_PROVIDER_SITE_OTHER): Payer: Self-pay | Admitting: Physician Assistant

## 2016-08-31 ENCOUNTER — Ambulatory Visit (INDEPENDENT_AMBULATORY_CARE_PROVIDER_SITE_OTHER): Payer: Self-pay | Admitting: Physician Assistant

## 2016-08-31 VITALS — BP 121/76 | HR 99 | Temp 98.1°F | Wt 239.6 lb

## 2016-08-31 DIAGNOSIS — D649 Anemia, unspecified: Secondary | ICD-10-CM

## 2016-08-31 DIAGNOSIS — J45990 Exercise induced bronchospasm: Secondary | ICD-10-CM

## 2016-08-31 DIAGNOSIS — Z23 Encounter for immunization: Secondary | ICD-10-CM

## 2016-08-31 DIAGNOSIS — I10 Essential (primary) hypertension: Secondary | ICD-10-CM

## 2016-08-31 MED ORDER — HYDROCHLOROTHIAZIDE 25 MG PO TABS: 25.0000 mg | ORAL_TABLET | Freq: Every day | ORAL | 3 refills | Status: DC

## 2016-08-31 MED ORDER — ALBUTEROL SULFATE HFA 108 (90 BASE) MCG/ACT IN AERS: 2.0000 | INHALATION_SPRAY | RESPIRATORY_TRACT | 5 refills | Status: DC | PRN

## 2016-08-31 MED ORDER — LOSARTAN POTASSIUM 50 MG PO TABS: 50.0000 mg | ORAL_TABLET | Freq: Every day | ORAL | 3 refills | Status: DC

## 2016-08-31 MED ORDER — FLUTICASONE-SALMETEROL 100-50 MCG/DOSE IN AEPB: 1.0000 | INHALATION_SPRAY | Freq: Two times a day (BID) | RESPIRATORY_TRACT | 5 refills | Status: DC

## 2016-08-31 NOTE — Progress Notes (Signed)
Subjective:  Patient ID: Brenda Mcintyre, female    DOB: [birth year]  Age: 41 y.o. MRN: [id number]  CC: f/u HTN  HPI Brenda CardSantita L Licht is a 41 y.o. female with a PMH of HTN, Asthma, and anemia presents on f/u of HTN. Was 168/96 at the last visit on 07/31/16. Prescribed Losartan 50 mg and HCTZ 25 mg. BP today is 121/76. Patient is taking medications as directed and without any side effects. Does not endorse, CP, palpitations, orthopnea, PND    Requests a prescription for Advair. Says she has SOB and chest tightness. Does not remember if she wheezes. Has to use albuterol when activities are increased such as when exercising.     Outpatient Medications Prior to Visit  Medication Sig Dispense Refill  . hydrochlorothiazide (HYDRODIURIL) 25 MG tablet Take 1 tablet (25 mg total) by mouth daily. 30 tablet 5  . losartan (COZAAR) 50 MG tablet Take 1 tablet (50 mg total) by mouth daily. 30 tablet 5  . albuterol (PROVENTIL HFA;VENTOLIN HFA) 108 (90 Base) MCG/ACT inhaler Inhale 2 puffs into the lungs every 4 (four) hours as needed for wheezing or shortness of breath. 1 Inhaler 5  . docusate sodium (COLACE) 50 MG capsule Take 1 capsule (50 mg total) by mouth 2 (two) times daily. (Patient not taking: Reported on 08/31/2016) 10 capsule 0  . ferrous sulfate 325 (65 FE) MG tablet Take 1 tablet (325 mg total) by mouth 3 (three) times daily with meals. (Patient not taking: Reported on 08/31/2016) 90 tablet 1   No facility-administered medications prior to visit.      ROS Review of Systems  Constitutional: Negative for chills, fever and malaise/fatigue.  Eyes: Negative for blurred vision.  Respiratory: Negative for shortness of breath.   Cardiovascular: Negative for chest pain and palpitations.  Gastrointestinal: Negative for abdominal pain and nausea.  Genitourinary: Negative for dysuria and hematuria.  Musculoskeletal: Negative for joint pain and myalgias.  Skin: Negative for rash.  Neurological:  Negative for tingling and headaches.  Psychiatric/Behavioral: Negative for depression. The patient is not nervous/anxious.     Objective:  BP 121/76 (BP Location: Left Arm, Patient Position: Sitting, Cuff Size: Large)   Pulse 99   Temp 98.1 F (36.7 C) (Oral)   Wt 239 lb 9.6 oz (108.7 kg)   LMP 08/27/2016 (Exact Date)   SpO2 99%   BMI 36.43 kg/m   BP/Weight 08/31/2016 07/31/2016 11/24/2015  Systolic BP 121 168 159  Diastolic BP 76 96 86  Wt. (Lbs) 239.6 247.6 -  BMI 36.43 37.65 40.47      Physical Exam  Constitutional: She is oriented to person, place, and time.  Well developed, overweight, NAD, polite  HENT:  Head: Normocephalic and atraumatic.  Eyes: No scleral icterus.  Neck: Normal range of motion. Neck supple. No thyromegaly present.  Cardiovascular: Normal rate, regular rhythm and normal heart sounds.   No carotid bruit bilaterally   Pulmonary/Chest: Effort normal and breath sounds normal.  Musculoskeletal: She exhibits no edema.  Neurological: She is alert and oriented to person, place, and time. No cranial nerve deficit. Coordination normal.  Skin: Skin is warm and dry. No rash noted. No erythema. No pallor.  Psychiatric: She has a normal mood and affect. Her behavior is normal. Thought content normal.  Vitals reviewed.    Assessment & Plan:   1. HYPERTENSION, BENIGN SYSTEMIC - Refill hydrochlorothiazide (HYDRODIURIL) 25 MG tablet; Take 1 tablet (25 mg total) by mouth daily.  Dispense: 90 tablet; Refill: 3 -  Refill losartan (COZAAR) 50 MG tablet; Take 1 tablet (50 mg total) by mouth daily.  Dispense: 90 tablet; Refill: 3  2. ASTHMA, EXERCISE INDUCED - Refill albuterol (PROVENTIL HFA;VENTOLIN HFA) 108 (90 Base) MCG/ACT inhaler; Inhale 2 puffs into the lungs every 4 (four) hours as needed for wheezing or shortness of breath.  Dispense: 1 Inhaler; Refill: 5 - Begin Fluticasone-Salmeterol (ADVAIR DISKUS) 100-50 MCG/DOSE AEPB; Inhale 1 puff into the lungs 2 (two)  times daily.  Dispense: 60 each; Refill: 5  3. Anemia, unspecified type - CBC with Differential  4. Hypertension, unspecified type - Well controlled on Losartan 50 mg and HCTZ 25 mg  5. Need for Tdap vaccination - Tdap vaccine greater than or equal to 7yo IM   Meds ordered this encounter  Medications  . albuterol (PROVENTIL HFA;VENTOLIN HFA) 108 (90 Base) MCG/ACT inhaler    Sig: Inhale 2 puffs into the lungs every 4 (four) hours as needed for wheezing or shortness of breath.    Dispense:  1 Inhaler    Refill:  5    Order Specific Question:   Supervising Provider    Answer:   Quentin AngstJEGEDE, OLUGBEMIGA E [id number]  . hydrochlorothiazide (HYDRODIURIL) 25 MG tablet    Sig: Take 1 tablet (25 mg total) by mouth daily.    Dispense:  90 tablet    Refill:  3    Order Specific Question:   Supervising Provider    Answer:   Quentin AngstJEGEDE, OLUGBEMIGA E [id number]  . losartan (COZAAR) 50 MG tablet    Sig: Take 1 tablet (50 mg total) by mouth daily.    Dispense:  90 tablet    Refill:  3    Order Specific Question:   Supervising Provider    Answer:   Quentin AngstJEGEDE, OLUGBEMIGA E [id number]]  . Fluticasone-Salmeterol (ADVAIR DISKUS) 100-50 MCG/DOSE AEPB    Sig: Inhale 1 puff into the lungs 2 (two) times daily.    Dispense:  60 each    Refill:  5    Order Specific Question:   Supervising Provider    Answer:   Quentin AngstJEGEDE, OLUGBEMIGA E [[id number]]    Follow-up: 6 months HTN  Loletta Specteroger David Gomez PA

## 2016-08-31 NOTE — Patient Instructions (Signed)

## 2016-09-01 ENCOUNTER — Encounter (INDEPENDENT_AMBULATORY_CARE_PROVIDER_SITE_OTHER): Payer: Self-pay

## 2016-09-01 LAB — CBC WITH DIFFERENTIAL/PLATELET
Basophils Absolute: 0 10*3/uL (ref 0.0–0.2)
Basos: 0 %
EOS (ABSOLUTE): 0.1 10*3/uL (ref 0.0–0.4)
EOS: 2 %
HEMATOCRIT: 30.2 % — AB (ref 34.0–46.6)
Hemoglobin: 8.6 g/dL — ABNORMAL LOW (ref 11.1–15.9)
IMMATURE GRANULOCYTES: 0 %
Immature Grans (Abs): 0 10*3/uL (ref 0.0–0.1)
Lymphocytes Absolute: 2.8 10*3/uL (ref 0.7–3.1)
Lymphs: 38 %
MCH: 18.7 pg — AB (ref 26.6–33.0)
MCHC: 28.5 g/dL — ABNORMAL LOW (ref 31.5–35.7)
MCV: 66 fL — AB (ref 79–97)
Monocytes Absolute: 0.8 10*3/uL (ref 0.1–0.9)
Monocytes: 11 %
NEUTROS ABS: 3.6 10*3/uL (ref 1.4–7.0)
Neutrophils: 49 %
PLATELETS: 232 10*3/uL (ref 150–379)
RBC: 4.6 x10E6/uL (ref 3.77–5.28)
RDW: 20.9 % — ABNORMAL HIGH (ref 12.3–15.4)
WBC: 7.3 10*3/uL (ref 3.4–10.8)

## 2016-11-01 IMAGING — DX DG CHEST 2V
2 series · 2 of 2 positions shown · non-contrast
Comparison: 05/18/2014

CLINICAL DATA: Chest pain off and on for 2 weeks

EXAM:
CHEST  2 VIEW

[chest pa]
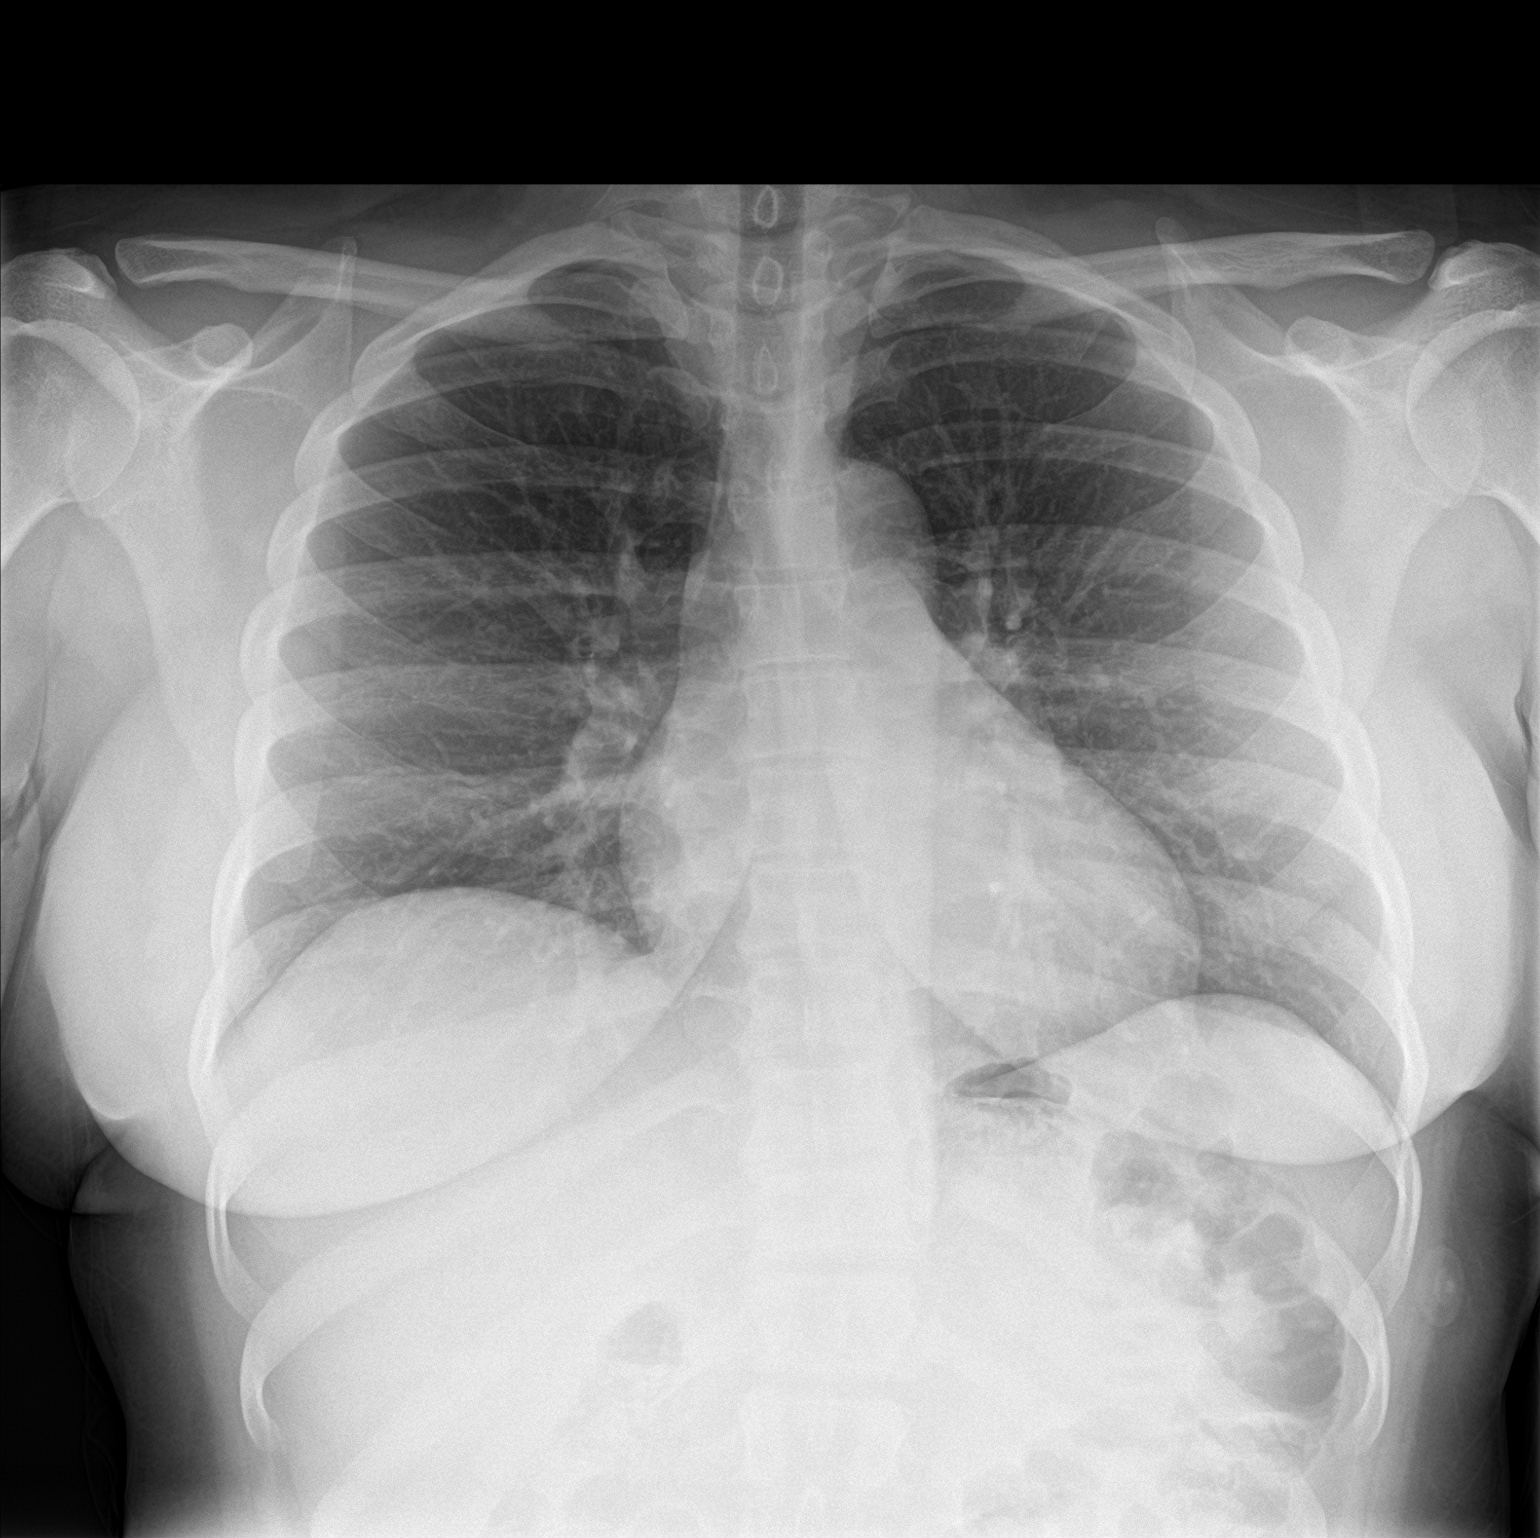

[chest lat]
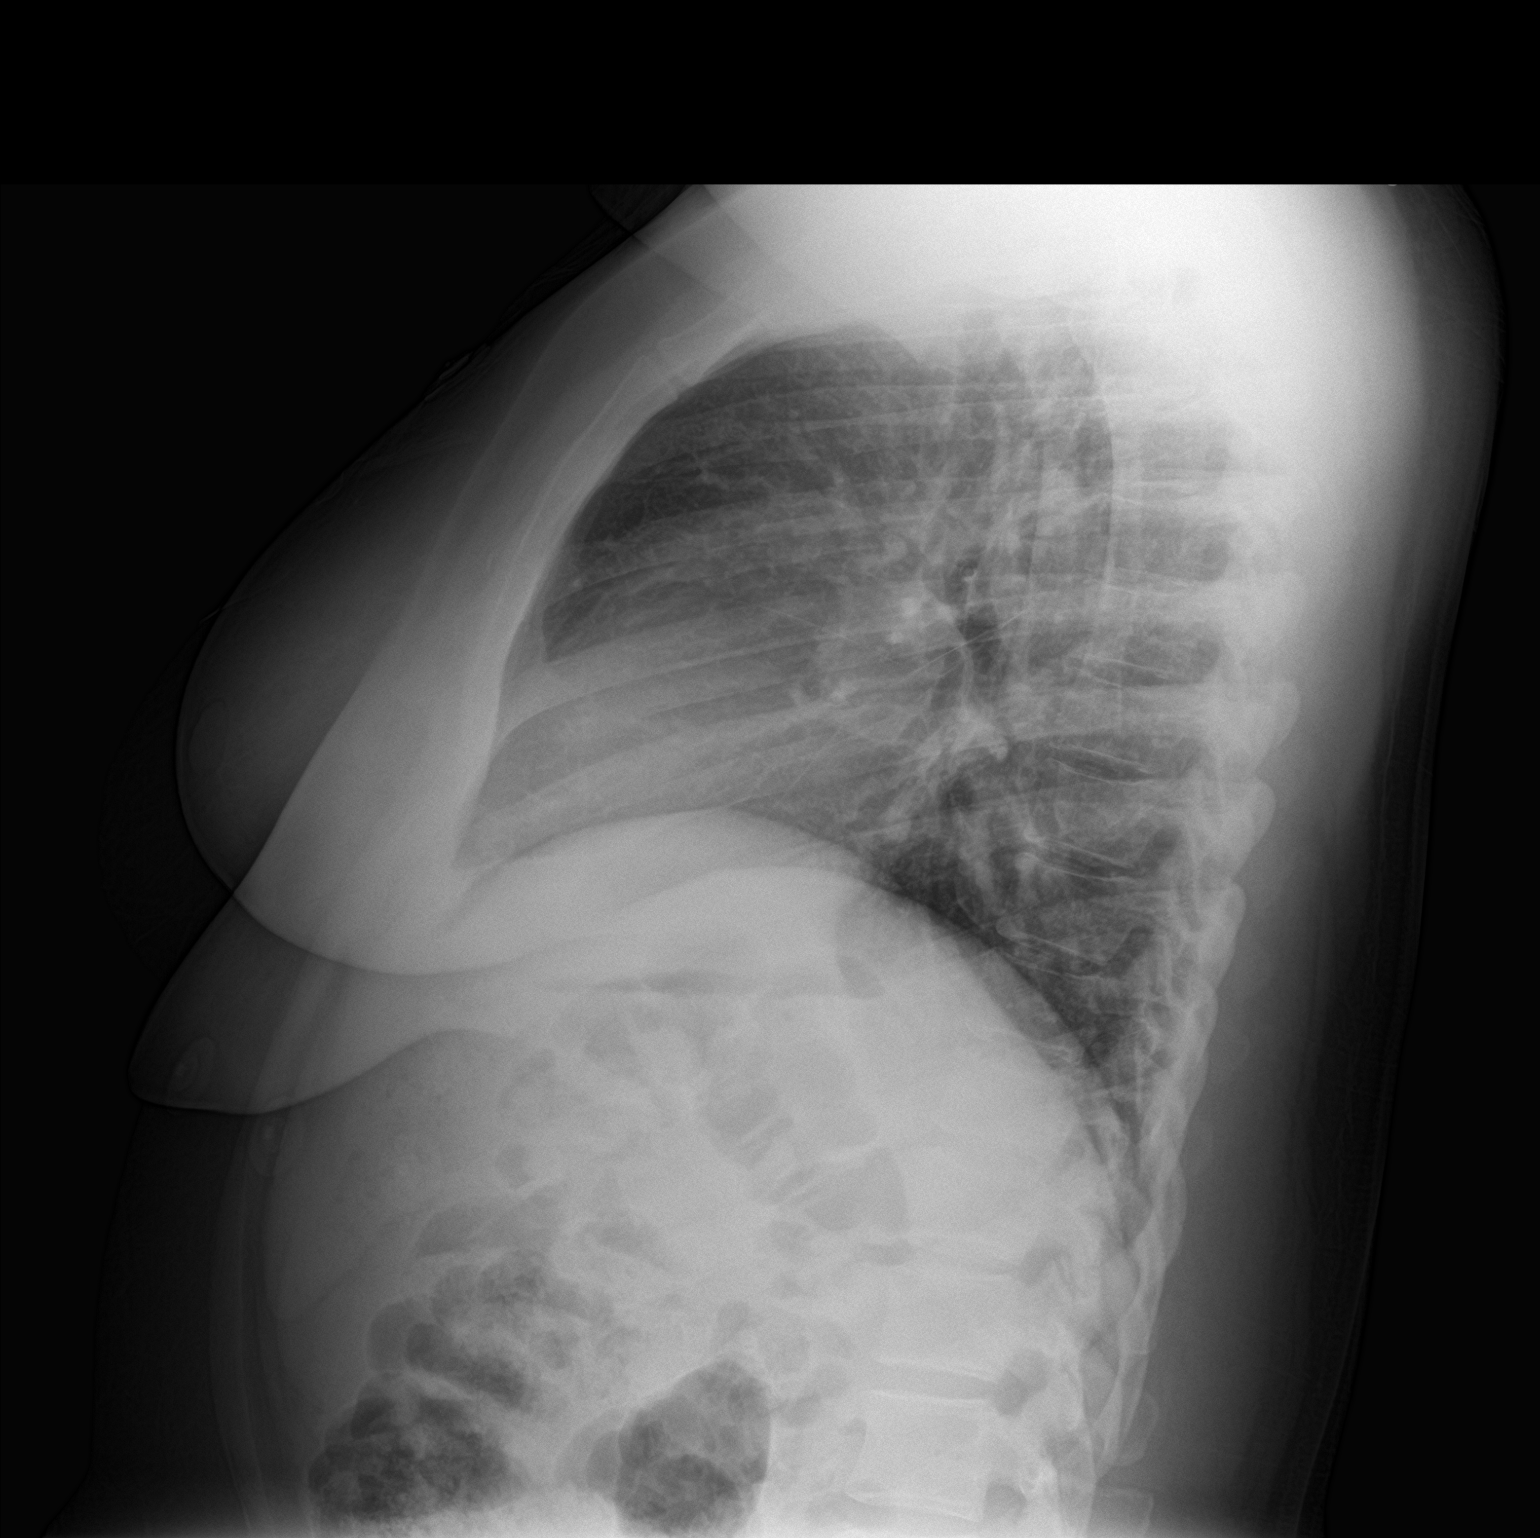

[2 of 2 positions shown; findings below may reference images not displayed]

FINDINGS: The heart size and mediastinal contours are within normal limits.
Both lungs are clear. The visualized skeletal structures are
unremarkable.
IMPRESSION: No active cardiopulmonary disease.

## 2017-02-14 ENCOUNTER — Other Ambulatory Visit: Payer: Self-pay

## 2017-02-14 ENCOUNTER — Ambulatory Visit (INDEPENDENT_AMBULATORY_CARE_PROVIDER_SITE_OTHER): Payer: Self-pay | Admitting: Physician Assistant

## 2017-02-14 ENCOUNTER — Encounter (INDEPENDENT_AMBULATORY_CARE_PROVIDER_SITE_OTHER): Payer: Self-pay | Admitting: Physician Assistant

## 2017-02-14 VITALS — BP 142/87 | HR 69 | Temp 99.0°F | Wt 231.8 lb

## 2017-02-14 DIAGNOSIS — I10 Essential (primary) hypertension: Secondary | ICD-10-CM

## 2017-02-14 DIAGNOSIS — J45990 Exercise induced bronchospasm: Secondary | ICD-10-CM

## 2017-02-14 DIAGNOSIS — Z0000 Encounter for general adult medical examination without abnormal findings: Secondary | ICD-10-CM

## 2017-02-14 DIAGNOSIS — D649 Anemia, unspecified: Secondary | ICD-10-CM

## 2017-02-14 LAB — POCT URINALYSIS DIPSTICK
Bilirubin, UA: NEGATIVE
Glucose, UA: NEGATIVE
Leukocytes, UA: NEGATIVE
Nitrite, UA: NEGATIVE
Protein, UA: 30
Spec Grav, UA: 1.02 (ref 1.010–1.025)
Urobilinogen, UA: 1 E.U./dL
pH, UA: 7 (ref 5.0–8.0)

## 2017-02-14 MED ORDER — HYDROCHLOROTHIAZIDE 25 MG PO TABS: 25.0000 mg | ORAL_TABLET | Freq: Every day | ORAL | 3 refills | Status: DC

## 2017-02-14 MED ORDER — FLUTICASONE-SALMETEROL 100-50 MCG/DOSE IN AEPB: 1.0000 | INHALATION_SPRAY | Freq: Two times a day (BID) | RESPIRATORY_TRACT | 5 refills | Status: DC

## 2017-02-14 MED ORDER — LOSARTAN POTASSIUM 50 MG PO TABS: 50.0000 mg | ORAL_TABLET | Freq: Every day | ORAL | 3 refills | Status: DC

## 2017-02-14 NOTE — Patient Instructions (Signed)
Anemia Anemia is a condition in which you do not have enough red blood cells or hemoglobin. Hemoglobin is a substance in red blood cells that carries oxygen. When you do not have enough red blood cells or hemoglobin (are anemic), your body cannot get enough oxygen and your organs may not work properly. As a result, you may feel very tired or have other problems. What are the causes? Common causes of anemia include:  Excessive bleeding. Anemia can be caused by excessive bleeding inside or outside the body, including bleeding from the intestine or from periods in women.  Poor nutrition.  Long-lasting (chronic) kidney, thyroid, and liver disease.  Bone marrow disorders.  Cancer and treatments for cancer.  HIV (human immunodeficiency virus) and AIDS (acquired immunodeficiency syndrome).  Treatments for HIV and AIDS.  Spleen problems.  Blood disorders.  Infections, medicines, and autoimmune disorders that destroy red blood cells.  What are the signs or symptoms? Symptoms of this condition include:  Minor weakness.  Dizziness.  Headache.  Feeling heartbeats that are irregular or faster than normal (palpitations).  Shortness of breath, especially with exercise.  Paleness.  Cold sensitivity.  Indigestion.  Nausea.  Difficulty sleeping.  Difficulty concentrating.  Symptoms may occur suddenly or develop slowly. If your anemia is mild, you may not have symptoms. How is this diagnosed? This condition is diagnosed based on:  Blood tests.  Your medical history.  A physical exam.  Bone marrow biopsy.  Your health care provider may also check your stool (feces) for blood and may do additional testing to look for the cause of your bleeding. You may also have other tests, including:  Imaging tests, such as a CT scan or MRI.  Endoscopy.  Colonoscopy.  How is this treated? Treatment for this condition depends on the cause. If you continue to lose a lot of blood,  you may need to be treated at a hospital. Treatment may include:  Taking supplements of iron, vitamin B12, or folic acid.  Taking a hormone medicine (erythropoietin) that can help to stimulate red blood cell growth.  Having a blood transfusion. This may be needed if you lose a lot of blood.  Making changes to your diet.  Having surgery to remove your spleen.  Follow these instructions at home:  Take over-the-counter and prescription medicines only as told by your health care provider.  Take supplements only as told by your health care provider.  Follow any diet instructions that you were given.  Keep all follow-up visits as told by your health care provider. This is important. Contact a health care provider if:  You develop new bleeding anywhere in the body. Get help right away if:  You are very weak.  You are short of breath.  You have pain in your abdomen or chest.  You are dizzy or feel faint.  You have trouble concentrating.  You have bloody or black, tarry stools.  You vomit repeatedly or you vomit up blood. Summary  Anemia is a condition in which you do not have enough red blood cells or enough of a substance in your red blood cells that carries oxygen (hemoglobin).  Symptoms may occur suddenly or develop slowly.  If your anemia is mild, you may not have symptoms.  This condition is diagnosed with blood tests as well as a medical history and physical exam. Other tests may be needed.  Treatment for this condition depends on the cause of the anemia. This information is not intended to replace advice   given to you by your health care provider. Make sure you discuss any questions you have with your health care provider. Document Released: 02/24/2004 Document Revised: 02/18/2016 Document Reviewed: 02/18/2016 Elsevier Interactive Patient Education  [employer].

## 2017-02-14 NOTE — Progress Notes (Signed)
Subjective:  Patient ID: Brenda Mcintyre, female    DOB: 1975-02-12  Age: 42 y.o. MRN: [id number]  CC: annual exam   HPI  Brenda Mcintyre is a 42 y.o. female with a PMH of HTN, Asthma, and anemia presents for an annual exam. BP noted to be elevated today. Says she ran out of medication and has not gotten a chance to fill meds at pharmacy. Been out of meds for 1-2 weeks. Was not able to fill Advair for her asthma either. Occasional asthma attacks but uses albuterol. Otherwise feels well and has no symptoms or complaints.    Outpatient Medications Prior to Visit  Medication Sig Dispense Refill  . albuterol (PROVENTIL HFA;VENTOLIN HFA) 108 (90 Base) MCG/ACT inhaler Inhale 2 puffs into the lungs every 4 (four) hours as needed for wheezing or shortness of breath. 1 Inhaler 5  . Fluticasone-Salmeterol (ADVAIR DISKUS) 100-50 MCG/DOSE AEPB Inhale 1 puff into the lungs 2 (two) times daily. (Patient not taking: Reported on 02/14/2017) 60 each 5  . hydrochlorothiazide (HYDRODIURIL) 25 MG tablet Take 1 tablet (25 mg total) by mouth daily. (Patient not taking: Reported on 02/14/2017) 90 tablet 3  . losartan (COZAAR) 50 MG tablet Take 1 tablet (50 mg total) by mouth daily. (Patient not taking: Reported on 02/14/2017) 90 tablet 3   No facility-administered medications prior to visit.      ROS Review of Systems  Constitutional: Positive for malaise/fatigue. Negative for chills and fever.  Eyes: Negative for blurred vision.  Respiratory: Negative for shortness of breath.   Cardiovascular: Negative for chest pain and palpitations.  Gastrointestinal: Negative for abdominal pain and nausea.  Genitourinary: Negative for dysuria and hematuria.  Musculoskeletal: Negative for joint pain and myalgias.  Skin: Negative for rash.  Neurological: Negative for tingling and headaches.  Psychiatric/Behavioral: Negative for depression. The patient is not nervous/anxious.     Objective:  BP (!) 142/87  (BP Location: Left Arm, Patient Position: Sitting, Cuff Size: Large)   Pulse 69   Temp 99 F (37.2 C) (Oral)   Wt 231 lb 12.8 oz (105.1 kg)   LMP 01/18/2017 (Approximate)   SpO2 99%   BMI 35.25 kg/m   BP/Weight 02/14/2017 08/31/2016 07/31/2016  Systolic BP 142 121 168  Diastolic BP 87 76 96  Wt. (Lbs) 231.8 239.6 247.6  BMI 35.25 36.43 37.65      Physical Exam  Constitutional: She is oriented to person, place, and time.  Well developed, overweight, NAD, polite  HENT:  Head: Normocephalic and atraumatic.  Eyes: Conjunctivae are normal. No scleral icterus.  Neck: Normal range of motion. Neck supple. No thyromegaly present.  Cardiovascular: Normal rate, regular rhythm and normal heart sounds. Exam reveals no gallop.  No murmur heard. Pulmonary/Chest: Effort normal and breath sounds normal. She has no wheezes. She has no rales.  Abdominal: Soft. Bowel sounds are normal. There is no tenderness.  Musculoskeletal: She exhibits no edema.  LEs, UEs, and back with full aROM  Neurological: She is alert and oriented to person, place, and time. She has normal reflexes. No cranial nerve deficit. Coordination normal.  Skin: Skin is warm and dry. No rash noted. No erythema. No pallor.  Psychiatric: She has a normal mood and affect. Her behavior is normal. Thought content normal.  Vitals reviewed.    Assessment & Plan:    1. Annual physical exam - CBC with Differential - Comprehensive metabolic panel - Vitamin B12 - Urinalysis Dipstick  2. ASTHMA, EXERCISE INDUCED - Fluticasone-Salmeterol (  ADVAIR DISKUS) 100-50 MCG/DOSE AEPB; Inhale 1 puff into the lungs 2 (two) times daily.  Dispense: 60 each; Refill: 5  3. Anemia, unspecified type - CBC  Meds ordered this encounter  Medications  . Fluticasone-Salmeterol (ADVAIR DISKUS) 100-50 MCG/DOSE AEPB    Sig: Inhale 1 puff into the lungs 2 (two) times daily.    Dispense:  60 each    Refill:  5    Order Specific Question:   Supervising  Provider    Answer:   Quentin Angst [id number]    Follow-up: Return in about 3 weeks (around 03/07/2017) for PAP.   Loletta Specter PA

## 2017-02-15 LAB — CBC WITH DIFFERENTIAL/PLATELET
Basophils Absolute: 0 10*3/uL (ref 0.0–0.2)
Basos: 0 %
EOS (ABSOLUTE): 0.2 10*3/uL (ref 0.0–0.4)
Eos: 3 %
HEMOGLOBIN: 8.8 g/dL — AB (ref 11.1–15.9)
Hematocrit: 30.1 % — ABNORMAL LOW (ref 34.0–46.6)
Immature Grans (Abs): 0 10*3/uL (ref 0.0–0.1)
Immature Granulocytes: 0 %
LYMPHS: 45 %
Lymphocytes Absolute: 2.3 10*3/uL (ref 0.7–3.1)
MCH: 18.9 pg — ABNORMAL LOW (ref 26.6–33.0)
MCHC: 29.2 g/dL — ABNORMAL LOW (ref 31.5–35.7)
MCV: 65 fL — ABNORMAL LOW (ref 79–97)
MONOCYTES: 11 %
Monocytes Absolute: 0.6 10*3/uL (ref 0.1–0.9)
NEUTROS ABS: 2.2 10*3/uL (ref 1.4–7.0)
Neutrophils: 41 %
Platelets: 214 10*3/uL (ref 150–379)
RBC: 4.65 x10E6/uL (ref 3.77–5.28)
RDW: 17.7 % — ABNORMAL HIGH (ref 12.3–15.4)
WBC: 5.2 10*3/uL (ref 3.4–10.8)

## 2017-02-15 LAB — COMPREHENSIVE METABOLIC PANEL
A/G RATIO: 1.4 (ref 1.2–2.2)
ALK PHOS: 80 IU/L (ref 39–117)
ALT: 30 IU/L (ref 0–32)
AST: 34 IU/L (ref 0–40)
Albumin: 4.2 g/dL (ref 3.5–5.5)
BUN/Creatinine Ratio: 13 (ref 9–23)
BUN: 10 mg/dL (ref 6–20)
CHLORIDE: 106 mmol/L (ref 96–106)
CO2: 24 mmol/L (ref 20–29)
Calcium: 9 mg/dL (ref 8.7–10.2)
Creatinine, Ser: 0.77 mg/dL (ref 0.57–1.00)
GFR calc non Af Amer: 98 mL/min/{1.73_m2} (ref >59)
GFR, EST AFRICAN AMERICAN: 112 mL/min/{1.73_m2} (ref >59)
Globulin, Total: 2.9 g/dL (ref 1.5–4.5)
Glucose: 67 mg/dL (ref 65–99)
POTASSIUM: 3.9 mmol/L (ref 3.5–5.2)
Sodium: 143 mmol/L (ref 134–144)
TOTAL PROTEIN: 7.1 g/dL (ref 6.0–8.5)

## 2017-02-15 LAB — VITAMIN B12: VITAMIN B 12: 312 pg/mL (ref 232–1245)

## 2017-02-16 ENCOUNTER — Telehealth (INDEPENDENT_AMBULATORY_CARE_PROVIDER_SITE_OTHER): Payer: Self-pay

## 2017-02-16 ENCOUNTER — Other Ambulatory Visit: Payer: Self-pay

## 2017-02-16 ENCOUNTER — Encounter (HOSPITAL_COMMUNITY): Payer: Self-pay

## 2017-02-16 DIAGNOSIS — R42 Dizziness and giddiness: Secondary | ICD-10-CM | POA: Insufficient documentation

## 2017-02-16 DIAGNOSIS — Z5321 Procedure and treatment not carried out due to patient leaving prior to being seen by health care provider: Secondary | ICD-10-CM | POA: Insufficient documentation

## 2017-02-16 LAB — CBC
HCT: 32.3 % — ABNORMAL LOW (ref 36.0–46.0)
Hemoglobin: 9.3 g/dL — ABNORMAL LOW (ref 12.0–15.0)
MCH: 19.3 pg — AB (ref 26.0–34.0)
MCHC: 28.8 g/dL — ABNORMAL LOW (ref 30.0–36.0)
MCV: 67.2 fL — ABNORMAL LOW (ref 78.0–100.0)
PLATELETS: 224 10*3/uL (ref 150–400)
RBC: 4.81 MIL/uL (ref 3.87–5.11)
RDW: 16.5 % — AB (ref 11.5–15.5)
WBC: 8.4 10*3/uL (ref 4.0–10.5)

## 2017-02-16 LAB — BASIC METABOLIC PANEL WITH GFR
Anion gap: 7 (ref 5–15)
BUN: 12 mg/dL (ref 6–20)
CO2: 26 mmol/L (ref 22–32)
Calcium: 9.3 mg/dL (ref 8.9–10.3)
Chloride: 102 mmol/L (ref 101–111)
Creatinine, Ser: 0.85 mg/dL (ref 0.44–1.00)
GFR calc Af Amer: >60 mL/min
GFR calc non Af Amer: >60 mL/min
Glucose, Bld: 94 mg/dL (ref 65–99)
Potassium: 3.4 mmol/L — ABNORMAL LOW (ref 3.5–5.1)
Sodium: 135 mmol/L (ref 135–145)

## 2017-02-16 LAB — I-STAT BETA HCG BLOOD, ED (MC, WL, AP ONLY): I-stat hCG, quantitative: <5 m[IU]/mL (ref <5)

## 2017-02-16 NOTE — Telephone Encounter (Signed)
Patient is aware of normal vitamin B12, and iron deficiency anemia. She was instructed to take iron pills as directed. Maryjean Mornempestt S Roberts, CMA

## 2017-02-16 NOTE — ED Triage Notes (Signed)
Pt reports that she lost consciousness at [employer] today. She remembers being in the restaurant and going to sit down because she felt dizzy. Then she just remembers waking up on the ground. Witnesses state that she hit her head on the concrete. No bleeding noted, but patient reports that she is sore where she fell. A&Ox4. Reports that she feels lethargic at this time. Ambulatory. Hx anemia and HTN.

## 2017-02-16 NOTE — Telephone Encounter (Signed)
-----   Message from Loletta Specteroger David Gomez, PA-C sent at 02/15/2017  1:37 PM EST ----- Vitamin B12 normal. Iron deficiency anemia. Take iron pills as directed.

## 2017-02-17 ENCOUNTER — Emergency Department (HOSPITAL_COMMUNITY)
Admission: EM | Admit: 2017-02-17 | Discharge: 2017-02-17 | Disposition: A | Discharge status: Home / Self Care | Payer: Self-pay | Attending: Emergency Medicine | Admitting: Emergency Medicine

## 2017-02-17 NOTE — ED Notes (Signed)
Called patient to go to room and no answer. 

## 2017-02-17 NOTE — ED Notes (Signed)
Called patient to go to a room and no answer 

## 2017-02-18 ENCOUNTER — Emergency Department (HOSPITAL_COMMUNITY): Payer: Self-pay

## 2017-02-18 ENCOUNTER — Encounter (HOSPITAL_COMMUNITY): Payer: Self-pay | Admitting: Emergency Medicine

## 2017-02-18 ENCOUNTER — Emergency Department (HOSPITAL_COMMUNITY)
Admission: EM | Admit: 2017-02-18 | Discharge: 2017-02-19 | Disposition: A | Discharge status: Home / Self Care | Payer: Self-pay | Attending: Emergency Medicine | Admitting: Emergency Medicine

## 2017-02-18 ENCOUNTER — Other Ambulatory Visit: Payer: Self-pay

## 2017-02-18 DIAGNOSIS — R42 Dizziness and giddiness: Secondary | ICD-10-CM | POA: Insufficient documentation

## 2017-02-18 DIAGNOSIS — J45909 Unspecified asthma, uncomplicated: Secondary | ICD-10-CM | POA: Insufficient documentation

## 2017-02-18 DIAGNOSIS — R51 Headache: Secondary | ICD-10-CM | POA: Insufficient documentation

## 2017-02-18 DIAGNOSIS — R519 Headache, unspecified: Secondary | ICD-10-CM

## 2017-02-18 DIAGNOSIS — Y929 Unspecified place or not applicable: Secondary | ICD-10-CM | POA: Insufficient documentation

## 2017-02-18 DIAGNOSIS — S0990XA Unspecified injury of head, initial encounter: Secondary | ICD-10-CM | POA: Insufficient documentation

## 2017-02-18 DIAGNOSIS — Y939 Activity, unspecified: Secondary | ICD-10-CM | POA: Insufficient documentation

## 2017-02-18 DIAGNOSIS — W19XXXA Unspecified fall, initial encounter: Secondary | ICD-10-CM | POA: Insufficient documentation

## 2017-02-18 DIAGNOSIS — I10 Essential (primary) hypertension: Secondary | ICD-10-CM | POA: Insufficient documentation

## 2017-02-18 DIAGNOSIS — Y998 Other external cause status: Secondary | ICD-10-CM | POA: Insufficient documentation

## 2017-02-18 DIAGNOSIS — Z79899 Other long term (current) drug therapy: Secondary | ICD-10-CM | POA: Insufficient documentation

## 2017-02-18 DIAGNOSIS — Z91040 Latex allergy status: Secondary | ICD-10-CM | POA: Insufficient documentation

## 2017-02-18 DIAGNOSIS — R55 Syncope and collapse: Secondary | ICD-10-CM | POA: Insufficient documentation

## 2017-02-18 LAB — CBC WITH DIFFERENTIAL/PLATELET
Basophils Absolute: 0 10*3/uL (ref 0.0–0.1)
Basophils Relative: 0 %
Eosinophils Absolute: 0.2 10*3/uL (ref 0.0–0.7)
Eosinophils Relative: 2 %
HEMATOCRIT: 32 % — AB (ref 36.0–46.0)
HEMOGLOBIN: 9.1 g/dL — AB (ref 12.0–15.0)
LYMPHS PCT: 37 %
Lymphs Abs: 3.1 10*3/uL (ref 0.7–4.0)
MCH: 19.4 pg — ABNORMAL LOW (ref 26.0–34.0)
MCHC: 28.4 g/dL — ABNORMAL LOW (ref 30.0–36.0)
MCV: 68.4 fL — AB (ref 78.0–100.0)
MONOS PCT: 9 %
Monocytes Absolute: 0.8 10*3/uL (ref 0.1–1.0)
NEUTROS PCT: 52 %
Neutro Abs: 4.3 10*3/uL (ref 1.7–7.7)
PLATELETS: 220 10*3/uL (ref 150–400)
RBC: 4.68 MIL/uL (ref 3.87–5.11)
RDW: 16.5 % — ABNORMAL HIGH (ref 11.5–15.5)
WBC: 8.4 10*3/uL (ref 4.0–10.5)

## 2017-02-18 LAB — I-STAT BETA HCG BLOOD, ED (MC, WL, AP ONLY): I-stat hCG, quantitative: <5 m[IU]/mL (ref <5)

## 2017-02-18 LAB — BASIC METABOLIC PANEL
ANION GAP: 11 (ref 5–15)
BUN: 16 mg/dL (ref 6–20)
CHLORIDE: 102 mmol/L (ref 101–111)
CO2: 25 mmol/L (ref 22–32)
Calcium: 8.9 mg/dL (ref 8.9–10.3)
Creatinine, Ser: 0.84 mg/dL (ref 0.44–1.00)
GFR calc Af Amer: >60 mL/min (ref >60)
GFR calc non Af Amer: >60 mL/min (ref >60)
GLUCOSE: 119 mg/dL — AB (ref 65–99)
POTASSIUM: 3.5 mmol/L (ref 3.5–5.1)
Sodium: 138 mmol/L (ref 135–145)

## 2017-02-18 MED ORDER — KETOROLAC TROMETHAMINE 60 MG/2ML IM SOLN
60.0000 mg | Freq: Once | INTRAMUSCULAR | Status: DC
  Filled 2017-02-18: qty 2

## 2017-02-18 MED ORDER — IBUPROFEN 400 MG PO TABS
600.0000 mg | ORAL_TABLET | Freq: Once | ORAL | Status: AC
  Administered 2017-02-18: 23:00:00 600 mg via ORAL
  Filled 2017-02-18: qty 1

## 2017-02-18 NOTE — ED Triage Notes (Signed)
Reports having a syncopal episode on Friday night.  Larey SeatFell and hit back of her head on a pole and then the floor at [employer].  Was seen by first responders.  [facility]  Now c/o continued headache.  Taking advil 400mg   for pain.  Last dose 1200.  Reports it does help.

## 2017-02-18 NOTE — ED Notes (Signed)
Spoke with Dr. Jodi MourningZavitz regarding symptoms and whether repeat lab work or scans needed.  Advised not to order any in triage.

## 2017-02-18 NOTE — Discharge Instructions (Signed)
You can take Tylenol or Ibuprofen as directed for pain. You can alternate Tylenol and Ibuprofen every 4 hours. If you take Tylenol at 1pm, then you can take Ibuprofen at 5pm. Then you can take Tylenol again at 9pm.   As we discussed, limit the amount of physical activity, screen time that you are participating in as that may exacerbate your symptoms.  Follow-up with your primary care doctor in the next 24-48 hours for further evaluation.  For any worsening headache, vision changes, vomiting, difficulty walking, numbness/weakness of the arms or legs or any other worsening or concerning symptoms.

## 2017-02-18 NOTE — ED Provider Notes (Signed)
[facility] Provider Note   CSN: [id number] Arrival date & time: 02/18/17  1926     History   Chief Complaint Chief Complaint  Patient presents with  . Headache    HPI Brenda Mcintyre is a 42 y.o. female past medical history of hypertension, anemia, asthma who presents for evaluation of persistent headache after head injury that occurred on 02/16/17.  Patient reports that she had a syncopal episode 2 nights ago, causing her to fall and hit the back of her head.  Patient reports that she generalized weakness  prior to syncopal episode.  Preceding chest pain, dizziness.  [facility] on [facility]/16/17 but left before being seen. Patient reports that since then she has has an intermittent generalized headache. She also reports some soreness to the posterior aspect of her head. She states that headache is improved with use of ibuprofen but will then return.  Patient states that she has been able to ambulate but reports feeling like she might pass out again.  She has not had any more syncopal episodes.  Patient states that this is been an ongoing issue since before the incident.  She states that she has been working with her doctor to establish the cause of her symptoms.  Patient denies any vision changes, chest pain, difficulty breathing, abdominal pain, nausea/vomiting, numbness/weakness of her arms or legs.  She is not on any blood thinners.   The history is provided by the patient.    Past Medical History:  Diagnosis Date  . Anemia   . Asthma   . Hypertension     Patient Active Problem List   Diagnosis Date Noted  . THROMBOCYTOPENIA 03/29/2006  . HYPERTENSION, BENIGN SYSTEMIC 03/29/2006  . ASTHMA, EXERCISE INDUCED 03/29/2006  . ECZEMA, ATOPIC DERMATITIS 03/29/2006    Past Surgical History:  Procedure Laterality Date  . CESAREAN SECTION    . VAGINAL BIRTH AFTER CESAREAN SECTION      OB History     No data available       Home Medications    Prior to Admission medications   Medication Sig Start Date End Date Taking? Authorizing Provider  albuterol (PROVENTIL HFA;VENTOLIN HFA) 108 (90 Base) MCG/ACT inhaler Inhale 2 puffs into the lungs every 4 (four) hours as needed for wheezing or shortness of breath. 08/31/16 09/30/16  Loletta SpecterGomez, Roger David, PA-C  Fluticasone-Salmeterol (ADVAIR DISKUS) 100-50 MCG/DOSE AEPB Inhale 1 puff into the lungs 2 (two) times daily. 02/14/17   Loletta SpecterGomez, Roger David, PA-C  hydrochlorothiazide (HYDRODIURIL) 25 MG tablet Take 1 tablet (25 mg total) by mouth daily. 02/14/17   Loletta SpecterGomez, Roger David, PA-C  losartan (COZAAR) 50 MG tablet Take 1 tablet (50 mg total) by mouth daily. 02/14/17   Loletta SpecterGomez, Roger David, PA-C    Family History Family History  Problem Relation Age of Onset  . Diabetes Other     Social History Social History   Tobacco Use  . Smoking status: Never Smoker  . Smokeless tobacco: Never Used  Substance Use Topics  . Alcohol use: Yes    Comment: occ  . Drug use: No     Allergies   Sulfa antibiotics and Latex   Review of Systems Review of Systems  Constitutional: Negative for fever.  Respiratory: Negative for cough and shortness of breath.   Cardiovascular: Negative for chest pain.  Gastrointestinal: Negative for abdominal pain, nausea and vomiting.  Genitourinary: Negative for dysuria and hematuria.  Musculoskeletal: Negative  for back pain and neck pain.  Neurological: Positive for headaches. Negative for dizziness, weakness and numbness.      Physical Exam Updated Vital Signs BP 123/76 (BP Location: Right Arm)   Pulse 91   Temp 98.2 F (36.8 C) (Oral)   Resp 16   SpO2 100%   Physical Exam  Constitutional: She is oriented to person, place, and time. She appears well-developed and well-nourished.  HENT:  Head: Normocephalic and atraumatic. Head is without raccoon's eyes and without Battle's sign.  Right Ear: Tympanic membrane  normal. No hemotympanum.  Left Ear: Tympanic membrane normal. No hemotympanum.  Mouth/Throat: Oropharynx is clear and moist and mucous membranes are normal.  No tenderness to palpation of skull. No deformities or crepitus noted. No open wounds, abrasions or lacerations.   Eyes: Conjunctivae, EOM and lids are normal. Pupils are equal, round, and reactive to light.  No nystagmus  Neck: Full passive range of motion without pain.  Full flexion/extension and lateral movement of neck fully intact. No bony midline tenderness. No deformities or crepitus.   Cardiovascular: Normal rate, regular rhythm, normal heart sounds and normal pulses. Exam reveals no gallop and no friction rub.  No murmur heard. Pulmonary/Chest: Effort normal and breath sounds normal.  No evidence of respiratory distress. Able to speak in full sentences without difficulty.  Abdominal: Soft. Normal appearance. There is no tenderness. There is no rigidity and no guarding.  Musculoskeletal: Normal range of motion.  Neurological: She is alert and oriented to person, place, and time. GCS eye subscore is 4. GCS verbal subscore is 5. GCS motor subscore is 6.  Cranial nerves III-XII intact Follows commands, Moves all extremities  5/5 strength to BUE and BLE  Sensation intact throughout all major nerve distributions Normal finger to nose. No dysdiadochokinesia. No pronator drift. No gait abnormalities  No slurred speech. No facial droop.   Skin: Skin is warm and dry. Capillary refill takes less than 2 seconds.  Psychiatric: She has a normal mood and affect. Her speech is normal.  Nursing note and vitals reviewed.    ED Treatments / Results  Labs (all labs ordered are listed, but only abnormal results are displayed) Labs Reviewed  CBC WITH DIFFERENTIAL/PLATELET - Abnormal; Notable for the following components:      Result Value   Hemoglobin 9.1 (*)    HCT 32.0 (*)    MCV 68.4 (*)    MCH 19.4 (*)    MCHC 28.4 (*)    RDW  16.5 (*)    All other components within normal limits  BASIC METABOLIC PANEL - Abnormal; Notable for the following components:   Glucose, Bld 119 (*)    All other components within normal limits  I-STAT BETA HCG BLOOD, ED ([facility], [facility], [facility] ONLY)    EKG  EKG Interpretation None       Radiology Ct Head Wo Contrast  Result Date: 02/18/2017 CLINICAL DATA:  Syncopal episode and hit back of head EXAM: CT HEAD WITHOUT CONTRAST TECHNIQUE: Contiguous axial images were obtained from the base of the skull through the vertex without intravenous contrast. COMPARISON:  Head CT 06/22/2011 FINDINGS: Brain: No evidence of acute infarction, hemorrhage, hydrocephalus, extra-axial collection or mass lesion/mass effect. Vascular: No hyperdense vessel or unexpected calcification. Skull: Normal. Negative for fracture or focal lesion. Sinuses/Orbits: No acute finding. Other: None IMPRESSION: Negative non contrasted CT appearance of the brain Electronically Signed   By: Jasmine Pang M.D.   On: 02/18/2017 23:42    Procedures  Procedures (including critical care time)  Medications Ordered in ED Medications  ibuprofen (ADVIL,MOTRIN) tablet 600 mg (600 mg Oral Given 02/18/17 2242)     Initial Impression / Assessment and Plan / ED Course  I have reviewed the triage vital signs and the nursing notes.  Pertinent labs & imaging results that were available during my care of the patient were reviewed by me and considered in my medical decision making (see chart for details).     42 y.o. female who presents for evaluation after head injury that occurred 2 days ago.  Had a syncopal episode where she fell backwards and hit her head.  Went for evaluation at [facility] emergency department but not complete treatment.  Comes today because she is continued to have persistent headache but states that is improved with ibuprofen use.  Additionally, patient states that she feels like she might pass out again when she walks, though  she has been able to walk without difficulty.  She has not had any more syncopal episodes.  Denies any vision changes, numbness/weakness of her arms or legs. Patient is afebrile, non-toxic appearing, sitting comfortably on examination table. Vital signs reviewed and stable. Patient is slightly hypertensive.  On exam, there is no evidence of hemotympanum, Battle's, raccoon's.  Patient has no neuro deficits on exam.  Patient ambulated in the department without any difficulty.  Given reassuring physical exam and per [facility] CT criteria, no imaging is indicated at this time.  Given reassuring exam and NEXUS criteria, no indication for cervical imaging at this time.  Plan for analgesics for headache.  We will plan to check EKG, CBC and BMP for evaluation of predictable feeling.  EKG reviewed.  Normal sinus rhythm, rate 84.  CBC shows anemia but appears to be consistent with previous.  BMP is unremarkable.   RN attempted to give patient Toradol for pain relief.  Patient declined Toradol.  I discussed with patient.  She declines any IV or IM medication.  She is only requesting ibuprofen.  At this time.  Will give her a dose of ibuprofen in the department.  Reevaluation after ibuprofen.  She states that the headache has improved but she still having headache.  Additionally, patient is sitting bent over on the bed with her head held in her hands.  Given concerns, will go ahead and evaluate head CT for further evaluation.  CT head reviewed.  Negative for any acute abnormality.  Discussed results with patient.  Discussed minor head injury precautions with patient.  Encouraged follow-up with primary care doctor for further evaluation.  Vital signs stable.  Hypertension has improved after analgesics.  Patient stable for discharge at this time. Patient had ample opportunity for questions and discussion. All patient's questions were answered with full understanding. Strict return precautions discussed. Patient  expresses understanding and agreement to plan.    Final Clinical Impressions(s) / ED Diagnoses   Final diagnoses:  Minor head injury, initial encounter  Generalized headache    ED Discharge Orders    None       Rosana Hoes 02/19/17 0011    Eber Hong, MD 02/20/17 0830

## 2017-03-07 ENCOUNTER — Ambulatory Visit (INDEPENDENT_AMBULATORY_CARE_PROVIDER_SITE_OTHER): Payer: Self-pay | Admitting: Physician Assistant

## 2017-03-07 ENCOUNTER — Encounter (INDEPENDENT_AMBULATORY_CARE_PROVIDER_SITE_OTHER): Payer: Self-pay | Admitting: Physician Assistant

## 2017-03-07 VITALS — BP 134/84 | HR 72 | Temp 98.1°F | Ht 68.0 in | Wt 229.6 lb

## 2017-03-07 DIAGNOSIS — D649 Anemia, unspecified: Secondary | ICD-10-CM

## 2017-03-07 DIAGNOSIS — Z79899 Other long term (current) drug therapy: Secondary | ICD-10-CM

## 2017-03-07 DIAGNOSIS — Z09 Encounter for follow-up examination after completed treatment for conditions other than malignant neoplasm: Secondary | ICD-10-CM

## 2017-03-07 DIAGNOSIS — R55 Syncope and collapse: Secondary | ICD-10-CM

## 2017-03-07 MED ORDER — FERROUS SULFATE 325 (65 FE) MG PO TBEC: 325.0000 mg | DELAYED_RELEASE_TABLET | Freq: Three times a day (TID) | ORAL | 1 refills | Status: DC

## 2017-03-07 MED ORDER — SENNOSIDES-DOCUSATE SODIUM 8.6-50 MG PO TABS: 1.0000 | ORAL_TABLET | Freq: Every day | ORAL | 0 refills | Status: DC

## 2017-03-07 NOTE — Patient Instructions (Signed)
Syncope Syncope is when you lose temporarily pass out (faint). Signs that you may be about to pass out include:  Feeling dizzy or light-headed.  Feeling sick to your stomach (nauseous).  Seeing all white or all black.  Having cold, clammy skin.  If you passed out, get help right away. Call your local emergency services (911 in the [place] Do not drive yourself to the hospital. Follow these instructions at home: Pay attention to any changes in your symptoms. Take these actions to help with your condition:  Have someone stay with you until you feel stable.  Do not drive, use machinery, or play sports until your doctor says it is okay.  Keep all follow-up visits as told by your doctor. This is important.  If you start to feel like you might pass out, lie down right away and raise (elevate) your feet above the level of your heart. Breathe deeply and steadily. Wait until all of the symptoms are gone.  Drink enough fluid to keep your pee (urine) clear or pale yellow.  If you are taking blood pressure or heart medicine, get up slowly and spend many minutes getting ready to sit and then stand. This can help with dizziness.  Take over-the-counter and prescription medicines only as told by your doctor.  Get help right away if:  You have a very bad headache.  You have unusual pain in your chest, tummy, or back.  You are bleeding from your mouth or rectum.  You have black or tarry poop (stool).  You have a very fast or uneven heartbeat (palpitations).  It hurts to breathe.  You pass out once or more than once.  You have jerky movements that you cannot control (seizure).  You are confused.  You have trouble walking.  You are very weak.  You have vision problems. These symptoms may be an emergency. Do not wait to see if the symptoms will go away. Get medical help right away. Call your local emergency services (911 in the [place] Do not drive yourself to the hospital. This  information is not intended to replace advice given to you by your health care provider. Make sure you discuss any questions you have with your health care provider. Document Released: 07/05/2007 Document Revised: 06/24/2015 Document Reviewed: 09/30/2014 Elsevier Interactive Patient Education  2018 Elsevier Inc.  

## 2017-03-07 NOTE — Progress Notes (Signed)
   Subjective:  Patient ID: Brenda Mcintyre, female    DOB: 19-Feb-1975  Age: 42 y.o. MRN: [id number]  CC: hospital f/u  HPI  Brenda L Williamsonis a 42 y.o.femalewith a PMH of HTN, Asthma, and anemia presents on ED f/u. Went to the ED 02/18/2017 for syncope that happened two nights previous to the ED visit. Had preceding chest pain, dizziness, and low HR. Hgb 9.1 g/dL rest of labs normal but no cardiac markers were done. EKG normal sinus rhythm. Patient denies vaginal bleeding or rectal bleeding. Does not endorse tingling, numbness, weakness, current CP, palpitations, SOB, HA, abdominal pain, f/c/n/v, rash, or GI/GU sxs.      Outpatient Medications Prior to Visit  Medication Sig Dispense Refill  . albuterol (PROVENTIL HFA;VENTOLIN HFA) 108 (90 Base) MCG/ACT inhaler Inhale 2 puffs into the lungs every 4 (four) hours as needed for wheezing or shortness of breath. 1 Inhaler 5  . Fluticasone-Salmeterol (ADVAIR DISKUS) 100-50 MCG/DOSE AEPB Inhale 1 puff into the lungs 2 (two) times daily. 60 each 5  . hydrochlorothiazide (HYDRODIURIL) 25 MG tablet Take 1 tablet (25 mg total) by mouth daily. 90 tablet 3  . losartan (COZAAR) 50 MG tablet Take 1 tablet (50 mg total) by mouth daily. 90 tablet 3   No facility-administered medications prior to visit.      ROS Review of Systems  Constitutional: Negative for chills, fever and malaise/fatigue.  Eyes: Negative for blurred vision.  Respiratory: Negative for shortness of breath.   Cardiovascular: Negative for chest pain and palpitations.  Gastrointestinal: Negative for abdominal pain and nausea.  Genitourinary: Negative for dysuria and hematuria.  Musculoskeletal: Negative for joint pain and myalgias.  Skin: Negative for rash.  Neurological: Negative for tingling and headaches.  Psychiatric/Behavioral: Negative for depression. The patient is not nervous/anxious.     Objective:   Vitals:   03/07/17 1511  BP: 134/84  Pulse: 72  Temp:  98.1 F (36.7 C)     Physical Exam  Constitutional: She is oriented to person, place, and time.  Well developed, overweight, NAD, polite  HENT:  Head: Normocephalic and atraumatic.  Eyes: Conjunctivae and EOM are normal. No scleral icterus.  Neck: Normal range of motion. Neck supple. No thyromegaly present.  Cardiovascular: Normal rate, regular rhythm and normal heart sounds.  No LE edema bilaterally  Pulmonary/Chest: Effort normal and breath sounds normal. No respiratory distress. She has no wheezes. She has no rales.  Musculoskeletal: She exhibits no edema.  Neurological: She is alert and oriented to person, place, and time. No cranial nerve deficit. Coordination normal.  Skin: Skin is warm and dry. No rash noted. No erythema. No pallor.  Psychiatric: She has a normal mood and affect. Her behavior is normal. Thought content normal.  Vitals reviewed.    Assessment & Plan:  1. Anemia, unspecified type - Anemia Profile B - D-dimer, quantitative (not at [facility]) - Comprehensive metabolic panel - Sickle cell screen  2. Hospital discharge follow-up - Records reviewed  3. Syncope, unspecified syncope type - Ambulatory referral to Cardiology   Follow-up: 4 weeks syncope  Loletta Specteroger David Gomez PA

## 2017-03-08 LAB — SPECIMEN STATUS REPORT

## 2017-03-09 ENCOUNTER — Telehealth (INDEPENDENT_AMBULATORY_CARE_PROVIDER_SITE_OTHER): Payer: Self-pay | Admitting: *Deleted

## 2017-03-09 ENCOUNTER — Telehealth (INDEPENDENT_AMBULATORY_CARE_PROVIDER_SITE_OTHER): Payer: Self-pay | Admitting: Physician Assistant

## 2017-03-09 NOTE — Telephone Encounter (Signed)
Connie from [employer] called to clarify test order . Please, call her at  [phone] option [phone] ext [phone]  Thank you

## 2017-03-09 NOTE — Telephone Encounter (Signed)
Patient verified DOB MA confirmed patients specimen was drawn from a light blue top (sodium citrate). Report will come over with patients results.

## 2017-03-09 NOTE — Telephone Encounter (Signed)
-----   Message from Loletta Specteroger David Gomez, PA-C sent at 03/09/2017 11:07 AM EST ----- Iron deficiency anemia. Take iron pills as directed.

## 2017-03-09 NOTE — Telephone Encounter (Signed)
Medical Assistant left message on patient's home and cell voicemail. Voicemail states to give a call back to [place] [facility] at [phone]. !!!Please inform patient of needing to take iron pills as directed to address her iron deficiency!!!

## 2017-04-04 ENCOUNTER — Ambulatory Visit (INDEPENDENT_AMBULATORY_CARE_PROVIDER_SITE_OTHER): Payer: Self-pay | Admitting: Physician Assistant

## 2017-04-04 ENCOUNTER — Encounter (INDEPENDENT_AMBULATORY_CARE_PROVIDER_SITE_OTHER): Payer: Self-pay | Admitting: Physician Assistant

## 2017-04-04 ENCOUNTER — Other Ambulatory Visit (HOSPITAL_COMMUNITY)
Admission: RE | Admit: 2017-04-04 | Discharge: 2017-04-04 | Disposition: A | Discharge status: Home / Self Care | Payer: Self-pay | Source: Ambulatory Visit | Attending: Physician Assistant | Admitting: Physician Assistant

## 2017-04-04 ENCOUNTER — Other Ambulatory Visit: Payer: Self-pay

## 2017-04-04 VITALS — BP 125/78 | HR 72 | Temp 98.3°F | Wt 231.0 lb

## 2017-04-04 DIAGNOSIS — Z124 Encounter for screening for malignant neoplasm of cervix: Secondary | ICD-10-CM | POA: Insufficient documentation

## 2017-04-04 NOTE — Progress Notes (Addendum)
   Subjective:  Patient ID: Brenda Mcintyre, female    DOB: March 20, 1975  Age: 42 y.o. MRN: [id number]  CC: pelvic exam  HPI Brenda L Williamsonis a 42 y.o.femalewith a PMH of HTN, Asthma, and anemia presents for a pap smear collection. No GU symptoms.      Outpatient Medications Prior to Visit  Medication Sig Dispense Refill  . ferrous sulfate 325 (65 FE) MG EC tablet Take 1 tablet (325 mg total) by mouth 3 (three) times daily with meals. 90 tablet 1  . Fluticasone-Salmeterol (ADVAIR DISKUS) 100-50 MCG/DOSE AEPB Inhale 1 puff into the lungs 2 (two) times daily. 60 each 5  . hydrochlorothiazide (HYDRODIURIL) 25 MG tablet Take 1 tablet (25 mg total) by mouth daily. 90 tablet 3  . losartan (COZAAR) 50 MG tablet Take 1 tablet (50 mg total) by mouth daily. 90 tablet 3  . albuterol (PROVENTIL HFA;VENTOLIN HFA) 108 (90 Base) MCG/ACT inhaler Inhale 2 puffs into the lungs every 4 (four) hours as needed for wheezing or shortness of breath. 1 Inhaler 5  . senna-docusate (SENOKOT-S) 8.6-50 MG tablet Take 1 tablet by mouth daily. (Patient not taking: Reported on 04/04/2017) 30 tablet 0   No facility-administered medications prior to visit.      ROS Review of Systems  Constitutional: Negative for chills, fever and malaise/fatigue.  Eyes: Negative for blurred vision.  Respiratory: Negative for shortness of breath.   Cardiovascular: Negative for chest pain and palpitations.  Gastrointestinal: Negative for abdominal pain and nausea.  Genitourinary: Negative for dysuria and hematuria.  Musculoskeletal: Negative for joint pain and myalgias.  Skin: Negative for rash.  Neurological: Negative for tingling and headaches.  Psychiatric/Behavioral: Negative for depression. The patient is not nervous/anxious.     Objective:  BP 125/78 (BP Location: Left Arm, Patient Position: Sitting, Cuff Size: Large)   Pulse 72   Temp 98.3 F (36.8 C) (Oral)   Wt 231 lb (104.8 kg)   LMP 03/07/2017  (Approximate)   SpO2 100%   BMI 35.12 kg/m   BP/Weight 04/04/2017 03/07/2017 02/19/2017  Systolic BP 125 134 124  Diastolic BP 78 84 76  Wt. (Lbs) 231 229.6 -  BMI 35.12 34.91 -      Physical Exam  Genitourinary:  Genitourinary Comments: No adnexal TTP or mass bilaterally     Assessment & Plan:    1. Screening for cervical cancer - Cytology - PAP([facility])    Follow-up: Return if symptoms worsen or fail to improve.   Loletta Specteroger David Dasha Kawabata PA

## 2017-04-04 NOTE — Patient Instructions (Signed)

## 2017-04-05 ENCOUNTER — Other Ambulatory Visit (INDEPENDENT_AMBULATORY_CARE_PROVIDER_SITE_OTHER): Payer: Self-pay | Admitting: Physician Assistant

## 2017-04-05 DIAGNOSIS — N760 Acute vaginitis: Principal | ICD-10-CM

## 2017-04-05 DIAGNOSIS — B9689 Other specified bacterial agents as the cause of diseases classified elsewhere: Secondary | ICD-10-CM

## 2017-04-05 LAB — COMPREHENSIVE METABOLIC PANEL
ALBUMIN: 4 g/dL (ref 3.5–5.5)
ALT: 29 IU/L (ref 0–32)
AST: 31 IU/L (ref 0–40)
Albumin/Globulin Ratio: 1.2 (ref 1.2–2.2)
Alkaline Phosphatase: 79 IU/L (ref 39–117)
BUN / CREAT RATIO: 14 (ref 9–23)
BUN: 11 mg/dL (ref 6–24)
Bilirubin Total: 0.2 mg/dL (ref 0.0–1.2)
CO2: 25 mmol/L (ref 20–29)
CREATININE: 0.8 mg/dL (ref 0.57–1.00)
Calcium: 9.6 mg/dL (ref 8.7–10.2)
Chloride: 104 mmol/L (ref 96–106)
GFR, EST AFRICAN AMERICAN: 107 mL/min/{1.73_m2} (ref >59)
GFR, EST NON AFRICAN AMERICAN: 93 mL/min/{1.73_m2} (ref >59)
GLOBULIN, TOTAL: 3.4 g/dL (ref 1.5–4.5)
Glucose: 99 mg/dL (ref 65–99)
Potassium: 4.2 mmol/L (ref 3.5–5.2)
SODIUM: 141 mmol/L (ref 134–144)
TOTAL PROTEIN: 7.4 g/dL (ref 6.0–8.5)

## 2017-04-05 LAB — CYTOLOGY - PAP
BACTERIAL VAGINITIS: POSITIVE — AB
CANDIDA VAGINITIS: NEGATIVE
Chlamydia: NEGATIVE
DIAGNOSIS: NEGATIVE
NEISSERIA GONORRHEA: NEGATIVE
Trichomonas: NEGATIVE

## 2017-04-05 LAB — D-DIMER, QUANTITATIVE

## 2017-04-05 LAB — ANEMIA PROFILE B
BASOS: 0 %
Basophils Absolute: 0 10*3/uL (ref 0.0–0.2)
EOS (ABSOLUTE): 0.2 10*3/uL (ref 0.0–0.4)
Eos: 3 %
FERRITIN: 8 ng/mL — AB (ref 15–150)
Folate: >20 ng/mL (ref >3.0)
Hematocrit: 30.7 % — ABNORMAL LOW (ref 34.0–46.6)
Hemoglobin: 9.3 g/dL — ABNORMAL LOW (ref 11.1–15.9)
IMMATURE GRANS (ABS): 0 10*3/uL (ref 0.0–0.1)
IRON SATURATION: 4 % — AB (ref 15–55)
IRON: 16 ug/dL — AB (ref 27–159)
Immature Granulocytes: 0 %
LYMPHS ABS: 2.3 10*3/uL (ref 0.7–3.1)
LYMPHS: 35 %
MCH: 20.3 pg — AB (ref 26.6–33.0)
MCHC: 30.3 g/dL — AB (ref 31.5–35.7)
MCV: 67 fL — AB (ref 79–97)
Monocytes Absolute: 0.6 10*3/uL (ref 0.1–0.9)
Monocytes: 9 %
Neutrophils Absolute: 3.6 10*3/uL (ref 1.4–7.0)
Neutrophils: 53 %
Platelets: 161 10*3/uL (ref 150–379)
RBC: 4.58 x10E6/uL (ref 3.77–5.28)
RDW: 18.8 % — ABNORMAL HIGH (ref 12.3–15.4)
Retic Ct Pct: 1.1 % (ref 0.6–2.6)
TIBC: 356 ug/dL (ref 250–450)
UIBC: 340 ug/dL (ref 131–425)
Vitamin B-12: 346 pg/mL (ref 232–1245)
WBC: 6.7 10*3/uL (ref 3.4–10.8)

## 2017-04-05 LAB — SICKLE CELL SCREEN: Sickle Cell Screen: NEGATIVE

## 2017-04-05 MED ORDER — METRONIDAZOLE 500 MG PO TABS: 500.0000 mg | ORAL_TABLET | Freq: Two times a day (BID) | ORAL | 0 refills | Status: AC

## 2017-04-06 ENCOUNTER — Telehealth (INDEPENDENT_AMBULATORY_CARE_PROVIDER_SITE_OTHER): Payer: Self-pay

## 2017-04-06 NOTE — Telephone Encounter (Signed)
-----   Message from Roger David Gomez, PA-C sent at 04/05/2017  5:45 PM EST ----- Positive for BV. I will send out metronidazole to [facility]. 

## 2017-04-06 NOTE — Telephone Encounter (Signed)
Left patient a voicemail asking her to call the office. Maryjean Mornempestt S Darlys Buis, CMA

## 2017-04-09 ENCOUNTER — Telehealth (INDEPENDENT_AMBULATORY_CARE_PROVIDER_SITE_OTHER): Payer: Self-pay

## 2017-04-09 NOTE — Telephone Encounter (Signed)
Left message asking patient to call the office. Tempestt S Roberts, CMA  

## 2017-04-09 NOTE — Telephone Encounter (Signed)
-----   Message from Loletta Specteroger David Gomez, PA-C sent at 04/05/2017  5:45 PM EST ----- Positive for BV. I will send out metronidazole to [facility].

## 2017-04-10 ENCOUNTER — Encounter (INDEPENDENT_AMBULATORY_CARE_PROVIDER_SITE_OTHER): Payer: Self-pay

## 2017-04-10 ENCOUNTER — Telehealth (INDEPENDENT_AMBULATORY_CARE_PROVIDER_SITE_OTHER): Payer: Self-pay

## 2017-04-10 NOTE — Telephone Encounter (Signed)
-----   Message from Roger David Gomez, PA-C sent at 04/05/2017  5:45 PM EST ----- Positive for BV. I will send out metronidazole to [facility]. 

## 2017-04-10 NOTE — Telephone Encounter (Signed)
Unable to reach patient by phone, results mailed. Maryjean Mornempestt S Shontez Sermon, CMA

## 2017-04-25 ENCOUNTER — Encounter: Payer: Self-pay | Admitting: Internal Medicine

## 2017-05-07 ENCOUNTER — Ambulatory Visit (INDEPENDENT_AMBULATORY_CARE_PROVIDER_SITE_OTHER): Payer: Self-pay | Admitting: Internal Medicine

## 2017-05-07 ENCOUNTER — Encounter: Payer: Self-pay | Admitting: Internal Medicine

## 2017-05-07 VITALS — BP 136/86 | HR 75 | Ht 67.0 in | Wt 233.2 lb

## 2017-05-07 DIAGNOSIS — R55 Syncope and collapse: Secondary | ICD-10-CM

## 2017-05-07 DIAGNOSIS — D509 Iron deficiency anemia, unspecified: Secondary | ICD-10-CM

## 2017-05-07 NOTE — Progress Notes (Signed)
New Outpatient Visit Date: 05/07/2017  Referring Provider: Loletta Specter, PA-C [address] [place], [place] [address]  Chief Complaint: Syncope  HPI:  Brenda Mcintyre is a 42 y.o. female who is being seen today for the evaluation of syncope at the request of Brenda Mcintyre. She has a history of hypertension, asthma, and anemia. She presented to the ED in mid January following two syncopal episodes during which she struck her head.  ED work-up was notable for anemia.  Subsequent anemia labs confirmed significant iron deficiency.  Brenda Mcintyre describes her syncopal episode as a sensation of lightheadedness that began while she was getting food at the [employer].  She initially noticed abdominal discomfort and nausea, with the feeling of needing to have a bowel movement.  She continued to wait in line but ultimately became lightheaded and briefly passed out.  When first responders arrived, they told her that her pulse seemed quite slow.  She reports 3 similar episodes that occurred 5 years ago, but nothing in the meantime.  All of them have setting of needing to have a bowel movement for being in the bathroom.  There are no accompanying symptoms such as chest pain, shortness of breath, and palpitations.  His Ricotta has never undergone cardiac workup in the past.  She denies a history of seizures.  Brenda Mcintyre reports preeclampsia during her first pregnancy at age 31.  She has noted occasional "heart flutters" that are not associated with the aforementioned syncopal episodes.  She had recurrent "sharp chest pains" without clear precipitants several years ago, though she has not experienced any in at least a year and a half.  She feels like improved eating habits have allowed this pain to resolve.  She has not typically consume caffeine, though she recently has had chai tea from  [employer].  --------------------------------------------------------------------------------------------------  Cardiovascular History & Procedures: Cardiovascular Problems:  Syncope  Risk Factors:  Hypertension  Cath/PCI:  None  CV Surgery:  None  EP Procedures and Devices:  None  Non-Invasive Evaluation(s):  None  Recent CV Pertinent Labs: Lab Results  Component Value Date   K 4.2 03/07/2017   MG 4.2 (H) 02/08/2007   BUN 11 03/07/2017   CREATININE 0.80 03/07/2017    --------------------------------------------------------------------------------------------------  Past Medical History:  Diagnosis Date  . Anemia   . Asthma   . Hypertension     Past Surgical History:  Procedure Laterality Date  . CESAREAN SECTION    . VAGINAL BIRTH AFTER CESAREAN SECTION      Current Meds  Medication Sig  . albuterol (PROVENTIL HFA;VENTOLIN HFA) 108 (90 Base) MCG/ACT inhaler Inhale 2 puffs into the lungs every 4 (four) hours as needed for wheezing or shortness of breath.  . ferrous sulfate 325 (65 FE) MG EC tablet Take 1 tablet (325 mg total) by mouth 3 (three) times daily with meals.  . Fluticasone-Salmeterol (ADVAIR DISKUS) 100-50 MCG/DOSE AEPB Inhale 1 puff into the lungs 2 (two) times daily.  . hydrochlorothiazide (HYDRODIURIL) 25 MG tablet Take 1 tablet (25 mg total) by mouth daily.  Marland Kitchen losartan (COZAAR) 50 MG tablet Take 1 tablet (50 mg total) by mouth daily.    Allergies: Sulfa antibiotics and Latex  Social History   Socioeconomic History  . Marital status: Single    Spouse name: Not on file  . Number of children: Not on file  . Years of education: Not on file  . Highest education level: Not on file  Occupational History  . Not  on file  Social Needs  . Financial resource strain: Not on file  . Food insecurity:    Worry: Not on file    Inability: Not on file  . Transportation needs:    Medical: Not on file    Non-medical: Not on file  Tobacco Use   . Smoking status: Never Smoker  . Smokeless tobacco: Never Used  Substance and Sexual Activity  . Alcohol use: Yes    Alcohol/week: 0.6 oz    Types: 1 Glasses of wine per week  . Drug use: No  . Sexual activity: Not on file  Lifestyle  . Physical activity:    Days per week: Not on file    Minutes per session: Not on file  . Stress: Not on file  Relationships  . Social connections:    Talks on phone: Not on file    Gets together: Not on file    Attends religious service: Not on file    Active member of club or organization: Not on file    Attends meetings of clubs or organizations: Not on file    Relationship status: Not on file  . Intimate partner violence:    Fear of current or ex partner: Not on file    Emotionally abused: Not on file    Physically abused: Not on file    Forced sexual activity: Not on file  Other Topics Concern  . Not on file  Social History Narrative  . Not on file    Family History  Problem Relation Age of Onset  . Diabetes Other   . Diabetes Mother   . Diabetes Maternal Grandmother   . Hypertension Maternal Grandfather   . Diabetes Maternal Grandfather     Review of Systems: Review of systems was notable for headaches.  Otherwise, a 12-system review of systems was performed and was negative except as noted in the HPI.  --------------------------------------------------------------------------------------------------  Physical Exam: BP 136/86   Pulse 75   Ht 5\' 7"  (1.702 m)   Wt 233 lb 3.2 oz (105.8 kg)   BMI 36.52 kg/m   General: NAD. HEENT: No conjunctival pallor or scleral icterus. Moist mucous membranes. OP clear. Neck: Supple without lymphadenopathy, thyromegaly, JVD, or HJR. No carotid bruit. Lungs: Normal work of breathing. Clear to auscultation bilaterally without wheezes or crackles. Heart: Regular rate and rhythm without murmurs, rubs, or gallops.  Unable to assess PMI due to body habitus. Abd: Bowel sounds present. Soft,  NT/ND.  Unable to assess HSM due to body habitus. Ext: No lower extremity edema. Radial, PT, and DP pulses are 2+ bilaterally Skin: Warm and dry without rash. Neuro: CNIII-XII intact. Strength and fine-touch sensation intact in upper and lower extremities bilaterally. Psych: Normal mood and affect.  EKG: Normal sinus rhythm without abnormalities.  Lab Results  Component Value Date   WBC 6.7 03/07/2017   HGB 9.3 (L) 03/07/2017   HCT 30.7 (L) 03/07/2017   MCV 67 (L) 03/07/2017   PLT 161 03/07/2017    Lab Results  Component Value Date   NA 141 03/07/2017   K 4.2 03/07/2017   CL 104 03/07/2017   CO2 25 03/07/2017   BUN 11 03/07/2017   CREATININE 0.80 03/07/2017   GLUCOSE 99 03/07/2017   ALT 29 03/07/2017    No results found for: CHOL, HDL, LDLCALC, LDLDIRECT, TRIG, CHOLHDL  --------------------------------------------------------------------------------------------------  ASSESSMENT AND PLAN: Syncope Description of Brenda Mcintyre's syncopal episodes, including accompanying abdominal discomfort, nausea, and feeling of needing to defecate,  are consistent with vasovagal syncope.  We discussed the mechanism behind vasovagal syncope and the importance of staying well-hydrated and recognizing the onset of an episode in order to prevent falling.  We have agreed to obtain a transthoracic echocardiogram to exclude structural abnormalities.  If this is normal, I recommend deferring further cardiac workup.  Given that Brenda Mcintyre has only had a single episode within the last 5 years, I think that ambulatory event monitoring would be low yield unless she has recurrent episodes.  Iron deficiency anemia Continue iron supplementation and follow-up with PCP.  Follow-up: Return to clinic in 6 months.  Yvonne Kendallhristopher Tanayia Wahlquist, MD 05/08/2017 9:54 PM

## 2017-05-07 NOTE — Patient Instructions (Addendum)
Medication Instructions:  Your physician recommends that you continue on your current medications as directed. Please refer to the Current Medication list given to you today.  -- If you need a refill on your cardiac medications before your next appointment, please call your pharmacy. --  Labwork: None ordered  Testing/Procedures:   Please schedule   Your physician has requested that you have an echocardiogram. Echocardiography is a painless test that uses sound waves to create images of your heart. It provides your doctor with information about the size and shape of your heart and how well your heart's chambers and valves are working. This procedure takes approximately one hour. There are no restrictions for this procedure.   Follow-Up: Your physician wants you to follow-up in: 6 months with Dr. Okey DupreEnd.    You will receive a reminder letter in the mail two months in advance. If you don't receive a letter, please call our office to schedule the follow-up appointment.  Thank you for choosing [facility]!!    Any Other Special Instructions Will Be Listed Below (If Applicable).   Vasovagal Syncope, keep well hydrate

## 2017-05-08 ENCOUNTER — Encounter: Payer: Self-pay | Admitting: Internal Medicine

## 2017-05-08 DIAGNOSIS — R55 Syncope and collapse: Secondary | ICD-10-CM | POA: Insufficient documentation

## 2017-05-08 DIAGNOSIS — D509 Iron deficiency anemia, unspecified: Secondary | ICD-10-CM | POA: Insufficient documentation

## 2017-05-14 ENCOUNTER — Other Ambulatory Visit: Payer: Self-pay

## 2017-05-14 ENCOUNTER — Emergency Department (HOSPITAL_COMMUNITY)
Admission: EM | Admit: 2017-05-14 | Discharge: 2017-05-15 | Disposition: A | Discharge status: Home / Self Care | Payer: Self-pay | Attending: Emergency Medicine | Admitting: Emergency Medicine

## 2017-05-14 ENCOUNTER — Encounter (HOSPITAL_COMMUNITY): Payer: Self-pay | Admitting: *Deleted

## 2017-05-14 ENCOUNTER — Ambulatory Visit (HOSPITAL_COMMUNITY): Payer: Self-pay | Attending: Cardiology

## 2017-05-14 DIAGNOSIS — D649 Anemia, unspecified: Secondary | ICD-10-CM | POA: Insufficient documentation

## 2017-05-14 DIAGNOSIS — I10 Essential (primary) hypertension: Secondary | ICD-10-CM | POA: Insufficient documentation

## 2017-05-14 DIAGNOSIS — Z5321 Procedure and treatment not carried out due to patient leaving prior to being seen by health care provider: Secondary | ICD-10-CM | POA: Insufficient documentation

## 2017-05-14 DIAGNOSIS — R51 Headache: Secondary | ICD-10-CM | POA: Insufficient documentation

## 2017-05-14 DIAGNOSIS — R001 Bradycardia, unspecified: Secondary | ICD-10-CM | POA: Insufficient documentation

## 2017-05-14 DIAGNOSIS — R079 Chest pain, unspecified: Secondary | ICD-10-CM | POA: Insufficient documentation

## 2017-05-14 DIAGNOSIS — R55 Syncope and collapse: Secondary | ICD-10-CM | POA: Insufficient documentation

## 2017-05-14 NOTE — ED Triage Notes (Signed)
Pt reports feeling "like my blood pressure is high." Has been having a headache with dizziness for the past several days. Reports having chest pain at one point that has since resolved. Pt has been seen by her PCP and had an echo done this morning

## 2017-05-15 LAB — CBC
HCT: 34.8 % — ABNORMAL LOW (ref 36.0–46.0)
Hemoglobin: 10.4 g/dL — ABNORMAL LOW (ref 12.0–15.0)
MCH: 22.1 pg — ABNORMAL LOW (ref 26.0–34.0)
MCHC: 29.9 g/dL — ABNORMAL LOW (ref 30.0–36.0)
MCV: 74 fL — AB (ref 78.0–100.0)
Platelets: 253 10*3/uL (ref 150–400)
RBC: 4.7 MIL/uL (ref 3.87–5.11)
RDW: 18.5 % — AB (ref 11.5–15.5)
WBC: 7.3 10*3/uL (ref 4.0–10.5)

## 2017-05-15 LAB — I-STAT TROPONIN, ED: TROPONIN I, POC: 0 ng/mL (ref 0.00–0.08)

## 2017-05-15 LAB — BASIC METABOLIC PANEL
Anion gap: 9 (ref 5–15)
BUN: 15 mg/dL (ref 6–20)
CO2: 26 mmol/L (ref 22–32)
CREATININE: 0.91 mg/dL (ref 0.44–1.00)
Calcium: 9.4 mg/dL (ref 8.9–10.3)
Chloride: 103 mmol/L (ref 101–111)
GFR calc non Af Amer: >60 mL/min (ref >60)
Glucose, Bld: 93 mg/dL (ref 65–99)
Potassium: 4.2 mmol/L (ref 3.5–5.1)
SODIUM: 138 mmol/L (ref 135–145)

## 2017-05-15 NOTE — ED Notes (Signed)
No answer in lobby x 2.

## 2017-10-26 ENCOUNTER — Ambulatory Visit: Payer: Self-pay | Admitting: Internal Medicine

## 2019-02-06 ENCOUNTER — Ambulatory Visit (INDEPENDENT_AMBULATORY_CARE_PROVIDER_SITE_OTHER): Payer: Self-pay | Admitting: Primary Care

## 2019-02-17 ENCOUNTER — Telehealth (INDEPENDENT_AMBULATORY_CARE_PROVIDER_SITE_OTHER): Payer: Self-pay | Admitting: Primary Care

## 2019-02-17 DIAGNOSIS — D649 Anemia, unspecified: Secondary | ICD-10-CM

## 2019-02-17 DIAGNOSIS — I10 Essential (primary) hypertension: Secondary | ICD-10-CM

## 2019-02-17 DIAGNOSIS — E669 Obesity, unspecified: Secondary | ICD-10-CM

## 2019-02-17 DIAGNOSIS — J45990 Exercise induced bronchospasm: Secondary | ICD-10-CM

## 2019-02-17 MED ORDER — ALBUTEROL SULFATE HFA 108 (90 BASE) MCG/ACT IN AERS: 2.0000 | INHALATION_SPRAY | RESPIRATORY_TRACT | 1 refills | Status: DC | PRN

## 2019-02-17 NOTE — Progress Notes (Signed)
Virtual Visit via Telephone Note  I connected with [name] on 02/17/19 at  8:30 AM EST by telephone and verified that I am speaking with the correct person using two identifiers.   I discussed the limitations, risks, security and privacy concerns of performing an evaluation and management service by telephone and the availability of in person appointments. I also discussed with the patient that there may be a patient responsible charge related to this service. The patient expressed understanding and agreed to proceed.   History of Present Illness: Brenda Mcintyre is having a web visit to establish care with new provider but is an established patient at [facility] last seen 03/2017. She admits to not having blood pressure mediations for a while and would like a in person visit . Also reviewing records has a history of anemia. Advised to have lab visit and Bp check and will determine medication at that time. Denies shortness of breath, headaches, chest pain or lower extremity edema Past Medical History:  Diagnosis Date  . Anemia   . Asthma   . Hypertension    Current Outpatient Medications on File Prior to Visit  Medication Sig Dispense Refill  . albuterol (PROVENTIL HFA;VENTOLIN HFA) 108 (90 Base) MCG/ACT inhaler Inhale 2 puffs into the lungs every 4 (four) hours as needed for wheezing or shortness of breath. 1 Inhaler 5  . ferrous sulfate 325 (65 FE) MG EC tablet Take 1 tablet (325 mg total) by mouth 3 (three) times daily with meals. 90 tablet 1  . Fluticasone-Salmeterol (ADVAIR DISKUS) 100-50 MCG/DOSE AEPB Inhale 1 puff into the lungs 2 (two) times daily. 60 each 5  . hydrochlorothiazide (HYDRODIURIL) 25 MG tablet Take 1 tablet (25 mg total) by mouth daily. 90 tablet 3  . losartan (COZAAR) 50 MG tablet Take 1 tablet (50 mg total) by mouth daily. 90 tablet 3   No current facility-administered medications on file prior to visit.   Observations/Objective: Review of Systems   Constitutional: Positive for malaise/fatigue.  All other systems reviewed and are negative.   Assessment and Plan: Diagnoses and all orders for this visit:  Anemia, unspecified type History of reported also low platelet and was followed by hematology but unknown etiology. Discussed what iron deficiency anemia was  red blood cells carry oxygen, size and color previously different . She will come in for labs.  Obesity without serious comorbidity, unspecified classification, unspecified obesity type Unknown BMI this is can be due to excess in caloric intake or underlining conditions. This may lead to other co-morbidities- CVD, hypoxia  CVD. Lifestyle modifications of diet and exercise may reduce obesity.   Essential hypertension Blood pressure unknown out of medication will not refill medication until labs are completed and in person visit for Bp evaluation. Not able to check Bp at home.  ASTHMA, EXERCISE INDUCED Exercise Asthma is recognize by classic presentation of symptoms which include cough, wheeze, and shortness of breath brought on by characteristic triggered by exertion and relieved by bronchodilating medications- albuterol HFA . refilled    Follow Up Instructions: CBC, CMP, Lipids Fasting   I discussed the assessment and treatment plan with the patient. The patient was provided an opportunity to ask questions and all were answered. The patient agreed with the plan and demonstrated an understanding of the instructions.   The patient was advised to call back or seek an in-person evaluation if the symptoms worsen or if the condition fails to improve as anticipated.  I provided 20 minutes of  non-face-to-face time during this encounter.   Kerin Perna, NP

## 2019-06-12 ENCOUNTER — Encounter (INDEPENDENT_AMBULATORY_CARE_PROVIDER_SITE_OTHER): Payer: Self-pay | Admitting: Primary Care

## 2019-06-12 ENCOUNTER — Ambulatory Visit (INDEPENDENT_AMBULATORY_CARE_PROVIDER_SITE_OTHER): Payer: PRIVATE HEALTH INSURANCE | Admitting: Primary Care

## 2019-06-12 ENCOUNTER — Other Ambulatory Visit: Payer: Self-pay

## 2019-06-12 VITALS — BP 190/91 | HR 85 | Temp 97.2°F | Ht 67.0 in | Wt 243.0 lb

## 2019-06-12 DIAGNOSIS — J45990 Exercise induced bronchospasm: Secondary | ICD-10-CM | POA: Diagnosis not present

## 2019-06-12 DIAGNOSIS — Z13220 Encounter for screening for lipoid disorders: Secondary | ICD-10-CM | POA: Diagnosis not present

## 2019-06-12 DIAGNOSIS — I10 Essential (primary) hypertension: Secondary | ICD-10-CM

## 2019-06-12 DIAGNOSIS — D696 Thrombocytopenia, unspecified: Secondary | ICD-10-CM

## 2019-06-12 DIAGNOSIS — F4323 Adjustment disorder with mixed anxiety and depressed mood: Secondary | ICD-10-CM

## 2019-06-12 DIAGNOSIS — E559 Vitamin D deficiency, unspecified: Secondary | ICD-10-CM

## 2019-06-12 DIAGNOSIS — D509 Iron deficiency anemia, unspecified: Secondary | ICD-10-CM

## 2019-06-12 MED ORDER — HYDROCHLOROTHIAZIDE 25 MG PO TABS: 25.0000 mg | ORAL_TABLET | Freq: Every day | ORAL | 1 refills | Status: DC

## 2019-06-12 MED ORDER — LOSARTAN POTASSIUM 50 MG PO TABS: 50.0000 mg | ORAL_TABLET | Freq: Every day | ORAL | 3 refills | Status: DC

## 2019-06-12 MED ORDER — FLUTICASONE-SALMETEROL 100-50 MCG/DOSE IN AEPB: 1.0000 | INHALATION_SPRAY | Freq: Two times a day (BID) | RESPIRATORY_TRACT | 5 refills | Status: DC

## 2019-06-12 MED ORDER — ALBUTEROL SULFATE HFA 108 (90 BASE) MCG/ACT IN AERS: 2.0000 | INHALATION_SPRAY | RESPIRATORY_TRACT | 1 refills | Status: DC | PRN

## 2019-06-12 NOTE — Progress Notes (Signed)
243  Headaches  Anxiety Med refills for inhalers and BP meds Referral to counseling due to a lot going on in her life  Chest pains

## 2019-06-12 NOTE — Patient Instructions (Signed)
Thrombocytopenia Thrombocytopenia means that you have a low number of platelets in your blood. Platelets are tiny cells in the blood. When you bleed, they clump together at the cut or injury to stop the bleeding. This is called blood clotting. If you do not have enough platelets, it can cause bleeding problems. Some cases of this condition are mild while others are more severe. What are the causes? This condition may be caused by:  Your body not making enough platelets. This may be caused by: ? Your bone marrow not making blood cells (aplastic anemia). ? Cancer in the bone marrow. ? Certain medicines. ? Infection in the bone marrow. ? Drinking a lot of alcohol.  Your body destroying platelets too quickly. This may be caused by: ? Certain immune diseases. ? Certain medicines. ? Certain blood clotting disorders. ? Certain disorders that are passed from parent to child (inherited). ? Certain bleeding disorders. ? Pregnancy. ? Having a spleen that is larger than normal. What are the signs or symptoms?  Bleeding that is not normal.  Nosebleeds.  Heavy menstrual periods.  Blood in the pee (urine) or poop (stool).  A purple-like color to the skin (purpura).  Bruising.  A rash that looks like pinpoint, purple-red spots (petechiae). How is this treated?  Treatment of another condition that is causing the low platelet count.  Medicines to help protect your platelets from being destroyed.  A replacement (transfusion) of platelets to stop or prevent bleeding.  Surgery to remove the spleen. Follow these instructions at home: Activity  Avoid activities that could cause you to get hurt or bruised. Follow instructions about how to prevent falls.  Take care not to cut yourself: ? When you shave. ? When you use scissors, needles, knives, or other tools.  Take care not to burn yourself: ? When you use an iron. ? When you cook. General instructions   Check your skin and the  inside of your mouth for bruises or blood as told by your doctor.  Check to see if there is blood in your spit (sputum), pee, and poop. Do this as told by your doctor.  Do not drink alcohol.  Take over-the-counter and prescription medicines only as told by your doctor.  Do not take any medicines that have aspirin or NSAIDs in them. These medicines can thin your blood and cause you to bleed.  Tell all of your doctors that you have this condition. Be sure to tell your dentist and eye doctor too. Contact a doctor if:  You have bruises and you do not know why. Get help right away if:  You are bleeding anywhere on your body.  You have blood in your spit, pee, or poop. Summary  Thrombocytopenia means that you have a low number of platelets in your blood.  Platelets are needed for blood clotting.  Symptoms of this condition include bleeding that is not normal, and bruising.  Take care not to cut or burn yourself. This information is not intended to replace advice given to you by your health care provider. Make sure you discuss any questions you have with your health care provider. Document Revised: 10/18/2017 Document Reviewed: 10/18/2017 Elsevier Patient Education  2020 [employer]  

## 2019-06-12 NOTE — Progress Notes (Signed)
Established Patient Office Visit  Subjective:  Patient ID: Brenda Mcintyre, female    DOB: 1975/07/20  Age: 44 y.o. MRN: [id number]  CC: No chief complaint on file.   HPI [name] is a 44 year old obese female presents with elevated blood pressure 190/91. She has complaints of headaches ,anxiety medication refills for inhalers and BP meds. Requesting referral to counseling due to a lot going on in her life. Note intermittent chest pain denies radiation mainly intermittent and in the middle of her chest, no back pain. Denies shortness of breath, or lower extremity edema   Past Medical History:  Diagnosis Date  . Anemia   . Asthma   . Hypertension     Past Surgical History:  Procedure Laterality Date  . breast mass removed    . CESAREAN SECTION    . VAGINAL BIRTH AFTER CESAREAN SECTION      Family History  Problem Relation Age of Onset  . Diabetes Other   . Diabetes Mother   . Diabetes Maternal Grandmother   . Hypertension Maternal Grandfather   . Diabetes Maternal Grandfather     Social History   Socioeconomic History  . Marital status: Single    Spouse name: Not on file  . Number of children: Not on file  . Years of education: Not on file  . Highest education level: Not on file  Occupational History  . Not on file  Tobacco Use  . Smoking status: Never Smoker  . Smokeless tobacco: Never Used  Substance and Sexual Activity  . Alcohol use: Yes    Alcohol/week: 1.0 standard drinks    Types: 1 Glasses of wine per week  . Drug use: No  . Sexual activity: Not on file  Other Topics Concern  . Not on file  Social History Narrative  . Not on file   Social Determinants of Health   Financial Resource Strain:   . Difficulty of Paying Living Expenses:   Food Insecurity:   . Worried About Programme researcher, broadcasting/film/video in the Last Year:   . Barista in the Last Year:   Transportation Needs:   . Freight forwarder (Medical):   Marland Kitchen Lack of  Transportation (Non-Medical):   Physical Activity:   . Days of Exercise per Week:   . Minutes of Exercise per Session:   Stress:   . Feeling of Stress :   Social Connections:   . Frequency of Communication with Friends and Family:   . Frequency of Social Gatherings with Friends and Family:   . Attends Religious Services:   . Active Member of Clubs or Organizations:   . Attends Banker Meetings:   Marland Kitchen Marital Status:   Intimate Partner Violence:   . Fear of Current or Ex-Partner:   . Emotionally Abused:   Marland Kitchen Physically Abused:   . Sexually Abused:     Outpatient Medications Prior to Visit  Medication Sig Dispense Refill  . albuterol (VENTOLIN HFA) 108 (90 Base) MCG/ACT inhaler Inhale 2 puffs into the lungs every 4 (four) hours as needed for wheezing or shortness of breath. 6.7 g 1  . ferrous sulfate 325 (65 FE) MG EC tablet Take 1 tablet (325 mg total) by mouth 3 (three) times daily with meals. 90 tablet 1  . Fluticasone-Salmeterol (ADVAIR DISKUS) 100-50 MCG/DOSE AEPB Inhale 1 puff into the lungs 2 (two) times daily. 60 each 5  . hydrochlorothiazide (HYDRODIURIL) 25 MG tablet Take 1  tablet (25 mg total) by mouth daily. (Patient not taking: Reported on 06/12/2019) 90 tablet 3  . losartan (COZAAR) 50 MG tablet Take 1 tablet (50 mg total) by mouth daily. (Patient not taking: Reported on 06/12/2019) 90 tablet 3   No facility-administered medications prior to visit.    Allergies  Allergen Reactions  . Sulfa Antibiotics Hives    From childhood   . Latex Rash and Other (See Comments)    Skin peels    ROS Review of Systems  Cardiovascular: Positive for chest pain.  Neurological: Positive for headaches.  Psychiatric/Behavioral: Positive for agitation. The patient is nervous/anxious.   All other systems reviewed and are negative.     Objective:    Physical Exam  Constitutional: She is oriented to person, place, and time. She appears well-developed and well-nourished.   HENT:  Head: Normocephalic.  Cardiovascular: Normal rate and regular rhythm.  Pulmonary/Chest: Effort normal and breath sounds normal.  Abdominal: Bowel sounds are normal.  Musculoskeletal:     Cervical back: Normal range of motion and neck supple.  Neurological: She is alert and oriented to person, place, and time.  Skin: Skin is warm.  Psychiatric: She has a normal mood and affect. Her behavior is normal. Judgment and thought content normal.    BP (!) 190/91   Pulse 85   Temp (!) 97.2 F (36.2 C) (Temporal)   Ht 5\' 7"  (1.702 m)   Wt 243 lb (110.2 kg)   LMP 05/29/2019 (Approximate)   SpO2 98%   BMI 38.06 kg/m  Wt Readings from Last 3 Encounters:  06/12/19 243 lb (110.2 kg)  05/07/17 233 lb 3.2 oz (105.8 kg)  04/04/17 231 lb (104.8 kg)     Health Maintenance Due  Topic Date Due  . COVID-19 Vaccine (1) Never done    There are no preventive care reminders to display for this patient.  Lab Results  Component Value Date   TSH 2.010 07/31/2016   Lab Results  Component Value Date   WBC 7.3 05/15/2017   HGB 10.4 (L) 05/15/2017   HCT 34.8 (L) 05/15/2017   MCV 74.0 (L) 05/15/2017   PLT 253 05/15/2017   Lab Results  Component Value Date   NA 138 05/15/2017   K 4.2 05/15/2017   CO2 26 05/15/2017   GLUCOSE 93 05/15/2017   BUN 15 05/15/2017   CREATININE 0.91 05/15/2017   BILITOT 0.2 03/07/2017   ALKPHOS 79 03/07/2017   AST 31 03/07/2017   ALT 29 03/07/2017   PROT 7.4 03/07/2017   ALBUMIN 4.0 03/07/2017   CALCIUM 9.4 05/15/2017   ANIONGAP 9 05/15/2017   No results found for: CHOL No results found for: HDL No results found for: LDLCALC No results found for: TRIG No results found for: CHOLHDL Lab Results  Component Value Date   HGBA1C 5.4 07/31/2016      Assessment & Plan:  Diagnoses and all orders for this visit:  HYPERTENSION, BENIGN SYSTEMIC Counseled on blood pressure goal of less than 130/80, low-sodium, DASH diet, medication compliance, 150  minutes of moderate intensity exercise per week. Discussed medication compliance, adverse effects.  ASTHMA, EXERCISE INDUCED She has cough, wheezing and shortness of breath cause by  triggers and relieved by bronchodilating medications.   Continue Advair Diskus 1 puff twice daily , and rescue only use if needed    Lipid screening secondary to obesity and increase risk with uncontrol hypertension  THROMBOCYTOPENIA With review with lab results   Iron deficiency anemia, unspecified  iron deficiency anemia type Discussed foods high in shellfish,liver, organ meats(liver, gizzard), and red meats can increase cholesterol and should be consumed in moderation.However; legumes(beans), spinach, pumpkin seeds, [place], broccoli, tofu, green leafy vegetables and dark chocolate can be consumed without concern to cholesterol.   Adjustment disorder with mixed anxiety and depressed mood Depression screen [facility] 2/9 06/12/2019 04/04/2017 02/14/2017  Decreased Interest 2 0 0  Down, Depressed, Hopeless 2 0 0  PHQ - 2 Score 4 0 0  Altered sleeping 3 - -  Tired, decreased energy 2 - -  Change in appetite 2 - -  Feeling bad or failure about yourself  2 - -  Trouble concentrating 3 - -  Moving slowly or fidgety/restless 3 - -  Suicidal thoughts 0 - -  PHQ-9 Score 19 - -  Difficult doing work/chores Very difficult - -  Schedule to see CSW   Follow-up: Return in about 4 weeks (around 07/10/2019) for BP in person schedule with CSW first available appointment.    Grayce Sessions, NP

## 2019-06-13 LAB — CBC WITH DIFFERENTIAL/PLATELET
Basophils Absolute: 0 10*3/uL (ref 0.0–0.2)
Basos: 0 %
EOS (ABSOLUTE): 0.6 10*3/uL — ABNORMAL HIGH (ref 0.0–0.4)
Eos: 9 %
Hematocrit: 28.9 % — ABNORMAL LOW (ref 34.0–46.6)
Hemoglobin: 7.5 g/dL — ABNORMAL LOW (ref 11.1–15.9)
Immature Grans (Abs): 0 10*3/uL (ref 0.0–0.1)
Immature Granulocytes: 0 %
Lymphocytes Absolute: 2.3 10*3/uL (ref 0.7–3.1)
Lymphs: 35 %
MCH: 15.9 pg — ABNORMAL LOW (ref 26.6–33.0)
MCHC: 26 g/dL — ABNORMAL LOW (ref 31.5–35.7)
MCV: 61 fL — ABNORMAL LOW (ref 79–97)
Monocytes Absolute: 0.8 10*3/uL (ref 0.1–0.9)
Monocytes: 13 %
Neutrophils Absolute: 2.9 10*3/uL (ref 1.4–7.0)
Neutrophils: 43 %
Platelets: 200 10*3/uL (ref 150–450)
RBC: 4.72 x10E6/uL (ref 3.77–5.28)
RDW: 18.4 % — ABNORMAL HIGH (ref 11.7–15.4)
WBC: 6.7 10*3/uL (ref 3.4–10.8)

## 2019-06-13 LAB — CMP14+EGFR
ALT: 27 IU/L (ref 0–32)
AST: 31 IU/L (ref 0–40)
Albumin/Globulin Ratio: 1.5 (ref 1.2–2.2)
Albumin: 4.4 g/dL (ref 3.8–4.8)
Alkaline Phosphatase: 81 IU/L (ref 39–117)
BUN/Creatinine Ratio: 18 (ref 9–23)
BUN: 16 mg/dL (ref 6–24)
Bilirubin Total: <0.2 mg/dL (ref 0.0–1.2)
CO2: 21 mmol/L (ref 20–29)
Calcium: 9.2 mg/dL (ref 8.7–10.2)
Chloride: 107 mmol/L — ABNORMAL HIGH (ref 96–106)
Creatinine, Ser: 0.9 mg/dL (ref 0.57–1.00)
GFR calc Af Amer: 91 mL/min/{1.73_m2} (ref >59)
GFR calc non Af Amer: 79 mL/min/{1.73_m2} (ref >59)
Globulin, Total: 3 g/dL (ref 1.5–4.5)
Glucose: 94 mg/dL (ref 65–99)
Potassium: 4.5 mmol/L (ref 3.5–5.2)
Sodium: 139 mmol/L (ref 134–144)
Total Protein: 7.4 g/dL (ref 6.0–8.5)

## 2019-06-13 LAB — LIPID PANEL
Chol/HDL Ratio: 2.6 ratio (ref 0.0–4.4)
Cholesterol, Total: 119 mg/dL (ref 100–199)
HDL: 45 mg/dL (ref >39)
LDL Chol Calc (NIH): 57 mg/dL (ref 0–99)
Triglycerides: 90 mg/dL (ref 0–149)
VLDL Cholesterol Cal: 17 mg/dL (ref 5–40)

## 2019-06-13 LAB — VITAMIN D 25 HYDROXY (VIT D DEFICIENCY, FRACTURES): Vit D, 25-Hydroxy: 13.6 ng/mL — ABNORMAL LOW (ref 30.0–100.0)

## 2019-06-16 ENCOUNTER — Other Ambulatory Visit (INDEPENDENT_AMBULATORY_CARE_PROVIDER_SITE_OTHER): Payer: Self-pay | Admitting: Primary Care

## 2019-06-17 ENCOUNTER — Ambulatory Visit (INDEPENDENT_AMBULATORY_CARE_PROVIDER_SITE_OTHER): Payer: PRIVATE HEALTH INSURANCE | Admitting: Licensed Clinical Social Worker

## 2019-06-17 ENCOUNTER — Other Ambulatory Visit (INDEPENDENT_AMBULATORY_CARE_PROVIDER_SITE_OTHER): Payer: Self-pay | Admitting: Primary Care

## 2019-06-17 ENCOUNTER — Other Ambulatory Visit: Payer: Self-pay

## 2019-06-17 DIAGNOSIS — F331 Major depressive disorder, recurrent, moderate: Secondary | ICD-10-CM

## 2019-06-17 DIAGNOSIS — D649 Anemia, unspecified: Secondary | ICD-10-CM

## 2019-06-17 MED ORDER — FERROUS SULFATE 325 (65 FE) MG PO TBEC: 325.0000 mg | DELAYED_RELEASE_TABLET | Freq: Every day | ORAL | 1 refills | Status: DC

## 2019-06-17 MED ORDER — OSCAL 500/200 D-3 500-200 MG-UNIT PO TABS: 1.0000 | ORAL_TABLET | Freq: Two times a day (BID) | ORAL | 1 refills | Status: DC

## 2019-06-17 MED ORDER — VITAMIN D (ERGOCALCIFEROL) 1.25 MG (50000 UNIT) PO CAPS: 50000.0000 [IU] | ORAL_CAPSULE | ORAL | 0 refills | Status: DC

## 2019-06-17 MED ORDER — CORRECTOL 5 MG PO TBEC: 5.0000 mg | DELAYED_RELEASE_TABLET | Freq: Every day | ORAL | 0 refills | Status: DC | PRN

## 2019-06-20 ENCOUNTER — Telehealth (INDEPENDENT_AMBULATORY_CARE_PROVIDER_SITE_OTHER): Payer: PRIVATE HEALTH INSURANCE | Admitting: Primary Care

## 2019-07-14 NOTE — BH Specialist Note (Signed)
Integrated Behavioral Health Initial Visit  MRN: [id number] Name: SAMANTHAJO PAYANO  Number of Integrated Behavioral Health Clinician visits:: 1/6 Session Start time: 9:40 AM  Session End time: 10:20 AM Total time: 40   Type of Service: Integrated Behavioral Health- Individual Interpretor:No. Interpretor Name and Language: NA   SUBJECTIVE: ARDELLE HALIBURTON is a 44 y.o. female accompanied by self Patient was referred by NP Randa Evens for depression and anxiety. Patient reports the following symptoms/concerns: Patient reports difficulty managing depression anxiety symptoms triggered by the loss of significant other. Include difficulty concentrating, decreased functioning, irritability, decreased energy, and crying episodes Duration of problem: 1 year; Severity of problem: moderate  OBJECTIVE: Mood: Anxious and Affect: Tearful Risk of harm to self or others: No plan to harm self or others  LIFE CONTEXT: Family and Social: Patient receives support from family and friends School/Work: Patient is employed Self-Care: Successfully identified Optician, dispensing Life Changes: Patient is grieving the loss of significant other  GOALS ADDRESSED: Patient will: 1. Reduce symptoms of: agitation, anxiety and depression 2. Increase knowledge and/or ability of: coping skills and healthy habits  3. Demonstrate ability to: Increase healthy adjustment to current life circumstances, Increase adequate support systems for patient/family and Begin healthy grieving over loss  INTERVENTIONS: Interventions utilized: Solution-Focused Strategies, Supportive Counseling, Psychoeducation and/or Health Education and Link to [facility]  Standardized Assessments completed: Not Needed  ASSESSMENT: Patient currently experiencing increase in depression anxiety symptoms triggered by grief. Patient reports difficulty functioning at home and work.   Patient may benefit from medication management and  psychotherapy. Stages of grief were discussed and support was provided. Supportive grief resources provided to patient.  PLAN: 1. Follow up with behavioral health clinician on : Contact LCSW with any additional behavioral health and/or resource needs 2. Behavioral recommendations: Utilize strategies discussed and resources provided 3. Referral(s): Integrated [facility] (In Clinic) and [facility] 4. "From scale of 1-10, how likely are you to follow plan?":   Bridgett Larsson, LCSW 07/14/2019 7:30 AM

## 2019-07-15 ENCOUNTER — Encounter (INDEPENDENT_AMBULATORY_CARE_PROVIDER_SITE_OTHER): Payer: Self-pay | Admitting: Primary Care

## 2019-07-15 ENCOUNTER — Other Ambulatory Visit: Payer: Self-pay

## 2019-07-15 ENCOUNTER — Ambulatory Visit (INDEPENDENT_AMBULATORY_CARE_PROVIDER_SITE_OTHER): Payer: PRIVATE HEALTH INSURANCE | Admitting: Primary Care

## 2019-07-15 VITALS — BP 165/99 | HR 68 | Temp 97.3°F | Ht 67.0 in | Wt 241.4 lb

## 2019-07-15 DIAGNOSIS — I10 Essential (primary) hypertension: Secondary | ICD-10-CM

## 2019-07-15 DIAGNOSIS — G4733 Obstructive sleep apnea (adult) (pediatric): Secondary | ICD-10-CM

## 2019-07-15 DIAGNOSIS — F4323 Adjustment disorder with mixed anxiety and depressed mood: Secondary | ICD-10-CM

## 2019-07-15 MED ORDER — AMLODIPINE BESYLATE 10 MG PO TABS: 10.0000 mg | ORAL_TABLET | Freq: Every day | ORAL | 3 refills | Status: DC

## 2019-07-15 MED ORDER — LOSARTAN POTASSIUM-HCTZ 100-25 MG PO TABS: 1.0000 | ORAL_TABLET | Freq: Every day | ORAL | 3 refills | Status: DC

## 2019-07-15 MED ORDER — FLUOXETINE HCL 20 MG PO TABS: 20.0000 mg | ORAL_TABLET | Freq: Every day | ORAL | 3 refills | Status: DC

## 2019-07-15 NOTE — Progress Notes (Signed)
Established Patient Office Visit  Subjective:  Patient ID: Brenda Mcintyre, female    DOB: February 12, 1975  Age: 44 y.o. MRN: [id number]  CC:  Chief Complaint  Patient presents with  . Blood Pressure Check  . Medication Management    for sleep/anxiety     HPI [name] is a 43 year old female presents for hypertension management, medication refills and anxiety and insomnia. Denies shortness of breath, headaches, chest pain or lower extremity edema, sudden onset, vision changes, unilateral weakness, dizziness, paresthesias.  Past Medical History:  Diagnosis Date  . Anemia   . Asthma   . Hypertension     Past Surgical History:  Procedure Laterality Date  . breast mass removed    . CESAREAN SECTION    . VAGINAL BIRTH AFTER CESAREAN SECTION      Family History  Problem Relation Age of Onset  . Diabetes Other   . Diabetes Mother   . Diabetes Maternal Grandmother   . Hypertension Maternal Grandfather   . Diabetes Maternal Grandfather     Social History   Socioeconomic History  . Marital status: Single    Spouse name: Not on file  . Number of children: Not on file  . Years of education: Not on file  . Highest education level: Not on file  Occupational History  . Not on file  Tobacco Use  . Smoking status: Never Smoker  . Smokeless tobacco: Never Used  Vaping Use  . Vaping Use: Never used  Substance and Sexual Activity  . Alcohol use: Yes    Alcohol/week: 1.0 standard drink    Types: 1 Glasses of wine per week  . Drug use: No  . Sexual activity: Not on file  Other Topics Concern  . Not on file  Social History Narrative  . Not on file   Social Determinants of Health   Financial Resource Strain:   . Difficulty of Paying Living Expenses:   Food Insecurity:   . Worried About Charity fundraiser in the Last Year:   . Arboriculturist in the Last Year:   Transportation Needs:   . Film/video editor (Medical):   Marland Kitchen Lack of Transportation  (Non-Medical):   Physical Activity:   . Days of Exercise per Week:   . Minutes of Exercise per Session:   Stress:   . Feeling of Stress :   Social Connections:   . Frequency of Communication with Friends and Family:   . Frequency of Social Gatherings with Friends and Family:   . Attends Religious Services:   . Active Member of Clubs or Organizations:   . Attends Archivist Meetings:   Marland Kitchen Marital Status:   Intimate Partner Violence:   . Fear of Current or Ex-Partner:   . Emotionally Abused:   Marland Kitchen Physically Abused:   . Sexually Abused:     Outpatient Medications Prior to Visit  Medication Sig Dispense Refill  . bisacodyl (CORRECTOL) 5 MG EC tablet Take 1 tablet (5 mg total) by mouth daily as needed for moderate constipation. 30 tablet 0  . calcium-vitamin D (OSCAL 500/200 D-3) 500-200 MG-UNIT tablet Take 1 tablet by mouth 2 (two) times daily. 180 tablet 1  . ferrous sulfate 325 (65 FE) MG EC tablet Take 1 tablet (325 mg total) by mouth daily with breakfast. 90 tablet 1  . Fluticasone-Salmeterol (ADVAIR DISKUS) 100-50 MCG/DOSE AEPB Inhale 1 puff into the lungs 2 (two) times daily. 60 each 5  .  Vitamin D, Ergocalciferol, (DRISDOL) 1.25 MG (50000 UNIT) CAPS capsule Take 1 capsule (50,000 Units total) by mouth every 7 (seven) days. 14 capsule 0  . hydrochlorothiazide (HYDRODIURIL) 25 MG tablet Take 1 tablet (25 mg total) by mouth daily. 90 tablet 1  . losartan (COZAAR) 50 MG tablet Take 1 tablet (50 mg total) by mouth daily. 90 tablet 3  . albuterol (VENTOLIN HFA) 108 (90 Base) MCG/ACT inhaler Inhale 2 puffs into the lungs every 4 (four) hours as needed for wheezing or shortness of breath. 6.7 g 1   No facility-administered medications prior to visit.    Allergies  Allergen Reactions  . Sulfa Antibiotics Hives    From childhood   . Latex Rash and Other (See Comments)    Skin peels    ROS Review of Systems  Respiratory: Positive for chest tightness, shortness of breath  and wheezing.   All other systems reviewed and are negative.     Objective:    Physical Exam Vitals:   07/15/19 0918 07/15/19 0930  BP: (!) 152/91 (!) 165/99  Pulse: 69 68  Temp: (!) 97.3 F (36.3 C)   TempSrc: Temporal   SpO2: 97%   Weight: 241 lb 6.4 oz (109.5 kg)   Height: 5\' 7"  (1.702 m)    General: Vital signs reviewed.  Patient is well-developed and well-nourished, in no acute distress and cooperative with exam.  Head: Normocephalic and atraumatic. Eyes: EOMI, conjunctivae normal, no scleral icterus.  Neck: Supple, trachea midline, normal ROM, no JVD, masses, thyromegaly, or carotid bruit present.  Cardiovascular: RRR, S1 normal, S2 normal, no murmurs, gallops, or rubs. Pulmonary/Chest: Clear to auscultation bilaterally, no wheezes, rales, or rhonchi. Abdominal: Soft, non-tender, non-distended, BS +, no masses, organomegaly, or guarding present.  Musculoskeletal: No joint deformities, erythema, or stiffness, ROM full and nontender. Extremities: No lower extremity edema bilaterally,  pulses symmetric and intact bilaterally. No cyanosis or clubbing. Neurological: A&O x3, Strength is normal and symmetric bilaterally, cranial nerve II-XII are grossly intact, no focal motor deficit, sensory intact to light touch bilaterally.  Skin: Warm, dry and intact. No rashes or erythema. Psychiatric: Normal mood and affect. speech and behavior is normal. Cognition and memory are normal.   Health Maintenance Due  Topic Date Due  . Hepatitis C Screening  Never done  . COVID-19 Vaccine (1) Never done    There are no preventive care reminders to display for this patient.  Lab Results  Component Value Date   TSH 2.010 07/31/2016   Lab Results  Component Value Date   WBC 6.7 06/12/2019   HGB 7.5 (L) 06/12/2019   HCT 28.9 (L) 06/12/2019   MCV 61 (L) 06/12/2019   PLT 200 06/12/2019   Lab Results  Component Value Date   NA 139 06/12/2019   K 4.5 06/12/2019   CO2 21 06/12/2019    GLUCOSE 94 06/12/2019   BUN 16 06/12/2019   CREATININE 0.90 06/12/2019   BILITOT <0.2 06/12/2019   ALKPHOS 81 06/12/2019   AST 31 06/12/2019   ALT 27 06/12/2019   PROT 7.4 06/12/2019   ALBUMIN 4.4 06/12/2019   CALCIUM 9.2 06/12/2019   ANIONGAP 9 05/15/2017   Lab Results  Component Value Date   CHOL 119 06/12/2019   Lab Results  Component Value Date   HDL 45 06/12/2019   Lab Results  Component Value Date   LDLCALC 57 06/12/2019   Lab Results  Component Value Date   TRIG 90 06/12/2019   Lab Results  Component Value Date   CHOLHDL 2.6 06/12/2019   Lab Results  Component Value Date   HGBA1C 5.4 07/31/2016      Assessment & Plan:  Daielle was seen today for blood pressure check and medication management.  Diagnoses and all orders for this visit:  OSA (obstructive sleep apnea) Patient presents with possible obstructive sleep apnea. Patent has a 10 years history of symptoms of daytime fatigue, morning headache and hypertension. Patient generally gets 3 or 4 hours of sleep per night, and states they generally have difficulty falling back asleep if awakened. Snoring of moderate severity is present. Apneic episodes are not present. Nasal obstruction is present.  Patient has had tonsillectomy.   Essential hypertension Counseled on blood pressure goal of less than 130/80, low-sodium, DASH diet, medication compliance, 150 minutes of moderate intensity exercise per week. Discussed medication compliance, adverse effects.\ Blood pressures medication changes increased losartan from 50mg  to 100- take 2 tablets daily in the morning with your HCTZ 25mg  in addition to amlodipine 10mg  daily. After the completion of your current losartan and HCTZ. There is a new prescription to pick up with a combination of losartan/hctz 100/25mg  take 1 daily and continue your amlodipine 10mg  daily   -     losartan-hydrochlorothiazide (HYZAAR) 100-25 MG tablet; Take 1 tablet by mouth daily. -      amLODipine (NORVASC) 10 MG tablet; Take 1 tablet (10 mg total) by mouth daily.  Adjustment disorder with mixed anxiety and depressed mood  We discussed options for treatment of anxiety including therapy and/or medication.  Will check basic labs to ensure thyroid is in normal range and that no other metabolic issues are obvious.  Reviewed concept of anxiety as biochemical imbalance of neurotransmitters and rationale for treatment. Discussed potential risks, expected benefits, possible side effects of the medicine. We also discussed how to take it correctly and dosing instructions. If she has any significant side effects to the medicine, she is to stop it and call for advice.  Instructed patient to contact office or on-call physician promptly should condition worsen or any new symptoms appear.    She was agreeable with this plan.   Spent 25 minutes (>50% of visit) discussing the risks of anxiety disorder, the pathophysiology, etiology, risks, and principles of treatment.     Follow-up: Return in about 6 weeks (around 08/26/2019) for in person medication re-eval.    , NP

## 2019-07-15 NOTE — Patient Instructions (Addendum)
Blood pressures medication changes increased losartan from 50mg  to 100- take 2 tablets daily in the morning with your HCTZ 25mg  in addition to amlodipine 10mg  daily. After the completion of your current losartan and HCTZ. There is a new prescription to pick up with a combination of losartan/hctz 100/25mg  take 1 daily and continue your amlodipine 10mg  daily   Fluoxetine capsules or tablets (Depression/Mood Disorders) What is this medicine? FLUOXETINE (floo OX e teen) belongs to a class of drugs known as selective serotonin reuptake inhibitors (SSRIs). It helps to treat mood problems such as depression, obsessive compulsive disorder, and panic attacks. It can also treat certain eating disorders. This medicine may be used for other purposes; ask your health care provider or pharmacist if you have questions. COMMON BRAND NAME(S): Prozac What should I tell my health care provider before I take this medicine? They need to know if you have any of these conditions:  bipolar disorder or a family history of bipolar disorder  bleeding disorders  glaucoma  heart disease  liver disease  low levels of sodium in the blood  seizures  suicidal thoughts, plans, or attempt; a previous suicide attempt by you or a family member  take MAOIs like Carbex, Eldepryl, Marplan, Nardil, and Parnate  take medicines that treat or prevent blood clots  thyroid disease  an unusual or allergic reaction to fluoxetine, other medicines, foods, dyes, or preservatives  pregnant or trying to get pregnant  breast-feeding How should I use this medicine? Take this medicine by mouth with a glass of water. Follow the directions on the prescription label. You can take this medicine with or without food. Take your medicine at regular intervals. Do not take it more often than directed. Do not stop taking this medicine suddenly except upon the advice of your doctor. Stopping this medicine too quickly may cause serious side  effects or your condition may worsen. A special MedGuide will be given to you by the pharmacist with each prescription and refill. Be sure to read this information carefully each time. Talk to your pediatrician regarding the use of this medicine in children. While this drug may be prescribed for children as young as 7 years for selected conditions, precautions do apply. Overdosage: If you think you have taken too much of this medicine contact a poison control center or emergency room at once. NOTE: This medicine is only for you. Do not share this medicine with others. What if I miss a dose? If you miss a dose, skip the missed dose and go back to your regular dosing schedule. Do not take double or extra doses. What may interact with this medicine? Do not take this medicine with any of the following medications:  other medicines containing fluoxetine, like Sarafem or Symbyax  cisapride  dronedarone  linezolid  MAOIs like Carbex, Eldepryl, Marplan, Nardil, and Parnate  methylene blue (injected into a vein)  pimozide  thioridazine This medicine may also interact with the following medications:  alcohol  amphetamines  aspirin and aspirin-like medicines  carbamazepine  certain medicines for depression, anxiety, or psychotic disturbances  certain medicines for migraine headaches like almotriptan, eletriptan, frovatriptan, naratriptan, rizatriptan, sumatriptan, zolmitriptan  digoxin  diuretics  fentanyl  flecainide  furazolidone  isoniazid  lithium  medicines for sleep  medicines that treat or prevent blood clots like warfarin, enoxaparin, and dalteparin  NSAIDs, medicines for pain and inflammation, like ibuprofen or naproxen  other medicines that prolong the QT interval (an abnormal heart rhythm)  phenytoin  procarbazine  propafenone  rasagiline  ritonavir  supplements like St. John's wort, kava kava,  valerian  tramadol  tryptophan  vinblastine This list may not describe all possible interactions. Give your health care provider a list of all the medicines, herbs, non-prescription drugs, or dietary supplements you use. Also tell them if you smoke, drink alcohol, or use illegal drugs. Some items may interact with your medicine. What should I watch for while using this medicine? Tell your doctor if your symptoms do not get better or if they get worse. Visit your doctor or health care professional for regular checks on your progress. Because it may take several weeks to see the full effects of this medicine, it is important to continue your treatment as prescribed by your doctor. Patients and their families should watch out for new or worsening thoughts of suicide or depression. Also watch out for sudden changes in feelings such as feeling anxious, agitated, panicky, irritable, hostile, aggressive, impulsive, severely restless, overly excited and hyperactive, or not being able to sleep. If this happens, especially at the beginning of treatment or after a change in dose, call your health care professional. Dennis Bast may get drowsy or dizzy. Do not drive, use machinery, or do anything that needs mental alertness until you know how this medicine affects you. Do not stand or sit up quickly, especially if you are an older patient. This reduces the risk of dizzy or fainting spells. Alcohol may interfere with the effect of this medicine. Avoid alcoholic drinks. Your mouth may get dry. Chewing sugarless gum or sucking hard candy, and drinking plenty of water may help. Contact your doctor if the problem does not go away or is severe. This medicine may affect blood sugar levels. If you have diabetes, check with your doctor or health care professional before you change your diet or the dose of your diabetic medicine. What side effects may I notice from receiving this medicine? Side effects that you should report to  your doctor or health care professional as soon as possible:  allergic reactions like skin rash, itching or hives, swelling of the face, lips, or tongue  anxious  black, tarry stools  breathing problems  changes in vision  confusion  elevated mood, decreased need for sleep, racing thoughts, impulsive behavior  eye pain  fast, irregular heartbeat  feeling faint or lightheaded, falls  feeling agitated, angry, or irritable  hallucination, loss of contact with reality  loss of balance or coordination  loss of memory  painful or prolonged erections  restlessness, pacing, inability to keep still  seizures  stiff muscles  suicidal thoughts or other mood changes  trouble sleeping  unusual bleeding or bruising  unusually weak or tired  vomiting Side effects that usually do not require medical attention (report to your doctor or health care professional if they continue or are bothersome):  change in appetite or weight  change in sex drive or performance  diarrhea  dry mouth  headache  increased sweating  nausea  tremors This list may not describe all possible side effects. Call your doctor for medical advice about side effects. You may report side effects to FDA at 1-800-FDA-1088. Where should I keep my medicine? Keep out of the reach of children. Store at room temperature between 15 and 30 degrees C (59 and 86 degrees F). Throw away any unused medicine after the expiration date. NOTE: This sheet is a summary. It may not cover all possible information. If you have questions about this  medicine, talk to your doctor, pharmacist, or health care provider.  2020 Elsevier/Gold Standard (2017-09-06 11:56:53)

## 2019-08-26 ENCOUNTER — Ambulatory Visit (INDEPENDENT_AMBULATORY_CARE_PROVIDER_SITE_OTHER): Payer: PRIVATE HEALTH INSURANCE | Admitting: Primary Care

## 2019-09-04 ENCOUNTER — Other Ambulatory Visit (INDEPENDENT_AMBULATORY_CARE_PROVIDER_SITE_OTHER): Payer: Self-pay | Admitting: Primary Care

## 2019-09-04 NOTE — Telephone Encounter (Signed)
Requested  medications are  due for refill today yes  Requested medications are on the active medication list yes  Last refill 5/21  Last visit May 2021   Notes to clinic Not Delegated.

## 2020-10-20 ENCOUNTER — Ambulatory Visit (INDEPENDENT_AMBULATORY_CARE_PROVIDER_SITE_OTHER): Payer: No Typology Code available for payment source | Admitting: Primary Care

## 2020-11-10 ENCOUNTER — Ambulatory Visit (INDEPENDENT_AMBULATORY_CARE_PROVIDER_SITE_OTHER): Payer: Self-pay | Admitting: Primary Care

## 2020-11-10 ENCOUNTER — Other Ambulatory Visit: Payer: Self-pay

## 2020-11-10 ENCOUNTER — Encounter (INDEPENDENT_AMBULATORY_CARE_PROVIDER_SITE_OTHER): Payer: Self-pay | Admitting: Primary Care

## 2020-11-10 VITALS — BP 157/94 | HR 70 | Temp 97.9°F | Ht 67.0 in | Wt 237.2 lb

## 2020-11-10 DIAGNOSIS — J45990 Exercise induced bronchospasm: Secondary | ICD-10-CM

## 2020-11-10 DIAGNOSIS — I10 Essential (primary) hypertension: Secondary | ICD-10-CM

## 2020-11-10 DIAGNOSIS — E669 Obesity, unspecified: Secondary | ICD-10-CM

## 2020-11-10 MED ORDER — LOSARTAN POTASSIUM 100 MG PO TABS
100.0000 mg | ORAL_TABLET | Freq: Every day | ORAL | 1 refills | Status: DC
  Filled 2020-11-10: qty 90, 90d supply, fill #0

## 2020-11-10 MED ORDER — FLUTICASONE-SALMETEROL 100-50 MCG/ACT IN AEPB
1.0000 | INHALATION_SPRAY | Freq: Two times a day (BID) | RESPIRATORY_TRACT | 3 refills | Status: DC
  Filled 2020-11-10 – 2020-11-11 (×2): qty 60, 30d supply, fill #0

## 2020-11-10 MED ORDER — AMLODIPINE BESYLATE 10 MG PO TABS
10.0000 mg | ORAL_TABLET | Freq: Every day | ORAL | 3 refills | Status: DC
  Filled 2020-11-10: qty 90, 90d supply, fill #0

## 2020-11-10 MED ORDER — HYDROCHLOROTHIAZIDE 25 MG PO TABS
25.0000 mg | ORAL_TABLET | Freq: Every day | ORAL | 1 refills | Status: DC
  Filled 2020-11-10: qty 90, 90d supply, fill #0

## 2020-11-10 MED ORDER — ALBUTEROL SULFATE HFA 108 (90 BASE) MCG/ACT IN AERS
2.0000 | INHALATION_SPRAY | RESPIRATORY_TRACT | 1 refills | Status: DC | PRN
  Filled 2020-11-10: qty 18, 16d supply, fill #0

## 2020-11-10 NOTE — Patient Instructions (Signed)

## 2020-11-10 NOTE — Progress Notes (Signed)
[facility]   [name] is a 45 y.o. obese female presents for hypertension evaluation, Denies shortness of breath, chest pain or lower extremity edema, sudden onset, vision changes, unilateral weakness, dizziness, paresthesias. Headaches she associated with fatigue, lack of sleep and dehydration.  Patient denies adherence with medications.  Dietary habits include: partially healthy diet  Exercise habits include:no Family / Social history: mother-CVA   Past Medical History:  Diagnosis Date   Anemia    Asthma    Hypertension    Past Surgical History:  Procedure Laterality Date   breast mass removed     CESAREAN SECTION     VAGINAL BIRTH AFTER CESAREAN SECTION     Allergies  Allergen Reactions   Sulfa Antibiotics Hives    From childhood    Latex Rash and Other (See Comments)    Skin peels   Current Outpatient Medications on File Prior to Visit  Medication Sig Dispense Refill   albuterol (VENTOLIN HFA) 108 (90 Base) MCG/ACT inhaler Inhale 2 puffs into the lungs every 4 (four) hours as needed for wheezing or shortness of breath. 6.7 g 1   amLODipine (NORVASC) 10 MG tablet Take 1 tablet (10 mg total) by mouth daily. (Patient not taking: Reported on 11/10/2020) 90 tablet 3   calcium-vitamin D (OSCAL 500/200 D-3) 500-200 MG-UNIT tablet Take 1 tablet by mouth 2 (two) times daily. (Patient not taking: Reported on 11/10/2020) 180 tablet 1   ferrous sulfate 325 (65 FE) MG EC tablet Take 1 tablet (325 mg total) by mouth daily with breakfast. (Patient not taking: Reported on 11/10/2020) 90 tablet 1   FLUoxetine (PROZAC) 20 MG tablet Take 1 tablet (20 mg total) by mouth daily. (Patient not taking: Reported on 11/10/2020) 30 tablet 3   Fluticasone-Salmeterol (ADVAIR DISKUS) 100-50 MCG/DOSE AEPB Inhale 1 puff into the lungs 2 (two) times daily. (Patient not taking: Reported on 11/10/2020) 60 each 5   losartan-hydrochlorothiazide (HYZAAR) 100-25 MG tablet Take 1  tablet by mouth daily. (Patient not taking: Reported on 11/10/2020) 90 tablet 3   No current facility-administered medications on file prior to visit.   Social History   Socioeconomic History   Marital status: Single    Spouse name: Not on file   Number of children: Not on file   Years of education: Not on file   Highest education level: Not on file  Occupational History   Not on file  Tobacco Use   Smoking status: Never   Smokeless tobacco: Never  Vaping Use   Vaping Use: Never used  Substance and Sexual Activity   Alcohol use: Yes    Alcohol/week: 1.0 standard drink    Types: 1 Glasses of wine per week   Drug use: No   Sexual activity: Not on file  Other Topics Concern   Not on file  Social History Narrative   Not on file   Social Determinants of Health   Financial Resource Strain: Not on file  Food Insecurity: Not on file  Transportation Needs: Not on file  Physical Activity: Not on file  Stress: Not on file  Social Connections: Not on file  Intimate Partner Violence: Not on file   Family History  Problem Relation Age of Onset   Diabetes Other    Diabetes Mother    Diabetes Maternal Grandmother    Hypertension Maternal Grandfather    Diabetes Maternal Grandfather      OBJECTIVE:  Vitals:   11/10/20 1536 11/10/20 1549  BP: Marland Kitchen)  156/90 (!) 157/94  Pulse: 70 70  Temp: 97.9 F (36.6 C)   TempSrc: Temporal   SpO2: 100%   Weight: 237 lb 3.2 oz (107.6 kg)   Height: 5\' 7"  (1.702 m)    Physical exam: General: Vital signs reviewed.  Patient is well-developed and well-nourished, obese female in no acute distress and cooperative with exam. Head: Normocephalic and atraumatic. Eyes: EOMI, conjunctivae normal, no scleral icterus. Neck: Supple, trachea midline, normal ROM, no JVD, masses, thyromegaly, or carotid bruit present. Cardiovascular: RRR, S1 normal, S2 normal, no murmurs, gallops, or rubs. Pulmonary/Chest: Clear to auscultation bilaterally, no wheezes,  rales, or rhonchi. Abdominal: Soft, non-tender, non-distended, BS +, no masses, organomegaly, or guarding present. Musculoskeletal: No joint deformities, erythema, or stiffness, ROM full and nontender. Extremities: No lower extremity edema bilaterally,  pulses symmetric and intact bilaterally. No cyanosis or clubbing. Neurological: A&O x3, Strength is normal Skin: Warm, dry and intact. No rashes or erythema. Psychiatric: Normal mood and affect. speech and behavior is normal. Cognition and memory are normal.  ROS Comprehensive ROS pertinent positive and negative noted in HPI   Last 3 Office BP readings: BP Readings from Last 3 Encounters:  11/10/20 (!) 157/94  07/15/19 (!) 165/99  06/12/19 (!) 190/91    BMET    Component Value Date/Time   NA 139 06/12/2019 1019   K 4.5 06/12/2019 1019   CL 107 (H) 06/12/2019 1019   CO2 21 06/12/2019 1019   GLUCOSE 94 06/12/2019 1019   GLUCOSE 93 05/15/2017 0009   BUN 16 06/12/2019 1019   CREATININE 0.90 06/12/2019 1019   CALCIUM 9.2 06/12/2019 1019   GFRNONAA 79 06/12/2019 1019   GFRAA 91 06/12/2019 1019    Renal function: CrCl cannot be calculated (Patient's most recent lab result is older than the maximum 21 days allowed.).  Clinical ASCVD: No  The ASCVD Risk score (Arnett DK, et al., 2019) failed to calculate for the following reasons:   The valid total cholesterol range is 130 to 320 mg/dL  ASCVD risk factors include- 2020   ASSESSMENT & PLAN:  Brenda Mcintyre was seen today for hypertension and medication refill.  Diagnoses and all orders for this visit:  Essential hypertension -Counseled on lifestyle modifications for blood pressure control including reduced dietary sodium, increased exercise, weight reduction and adequate sleep. Also, educated patient about the risk for cardiovascular events, stroke and heart attack. Also counseled patient about the importance of medication adherence. If you participate in smoking, it is important to  stop using tobacco as this will increase the risks associated with uncontrolled blood pressure.   -Hypertension longstanding diagnosed currently out of medication. Patient is not adherent with current medications.   Goal BP:  For patients younger than 60: Goal BP < 130/80. For patients 60 and older: Goal BP < 140/90. For patients with diabetes: Goal BP < 130/80. Your most recent BP: 157/94  Minimize salt intake. Minimize alcohol intake  -     amLODipine (NORVASC) 10 MG tablet; Take 1 tablet (10 mg total) by mouth daily. -     losartan (COZAAR) 100 MG tablet; Take 1 tablet (100 mg total) by mouth daily. -     hydrochlorothiazide (HYDRODIURIL) 25 MG tablet; Take 1 tablet (25 mg total) by mouth daily.  Obesity without serious comorbidity, unspecified classification, unspecified obesity type Obesity is 30-39 indicating an excess in caloric intake or underlining conditions. This may lead to other co-morbidities. Lifestyle modifications of diet and exercise may reduce obesity.  ASTHMA, EXERCISE INDUCED -     fluticasone-salmeterol (ADVAIR) 100-50 MCG/ACT AEPB; Inhale 1 puff into the lungs 2 (two) times daily. -     albuterol (VENTOLIN HFA) 108 (90 Base) MCG/ACT inhaler; Inhale 2 puffs into the lungs every 4 (four) hours as needed for wheezing or shortness of breath.    This note has been created with Education officer, environmental. Any transcriptional errors are unintentional.   Grayce Sessions, NP 11/10/2020, 3:52 PM

## 2020-11-11 ENCOUNTER — Other Ambulatory Visit: Payer: Self-pay

## 2020-11-12 ENCOUNTER — Other Ambulatory Visit: Payer: Self-pay

## 2020-11-18 ENCOUNTER — Other Ambulatory Visit: Payer: Self-pay

## 2020-11-19 ENCOUNTER — Other Ambulatory Visit: Payer: Self-pay

## 2020-11-20 ENCOUNTER — Other Ambulatory Visit: Payer: Self-pay

## 2020-11-20 ENCOUNTER — Encounter (HOSPITAL_COMMUNITY): Payer: Self-pay | Admitting: Emergency Medicine

## 2020-11-20 ENCOUNTER — Ambulatory Visit (HOSPITAL_COMMUNITY)
Admission: EM | Admit: 2020-11-20 | Discharge: 2020-11-20 | Disposition: A | Discharge status: Home / Self Care | Payer: Self-pay | Attending: Emergency Medicine | Admitting: Emergency Medicine

## 2020-11-20 DIAGNOSIS — K047 Periapical abscess without sinus: Secondary | ICD-10-CM

## 2020-11-20 MED ORDER — PENICILLIN G BENZATHINE 1200000 UNIT/2ML IM SUSY
PREFILLED_SYRINGE | INTRAMUSCULAR | Status: AC
  Filled 2020-11-20: qty 2

## 2020-11-20 MED ORDER — PENICILLIN V POTASSIUM 500 MG PO TABS: 500.0000 mg | ORAL_TABLET | Freq: Three times a day (TID) | ORAL | 0 refills | Status: AC

## 2020-11-20 MED ORDER — PENICILLIN G BENZATHINE 1200000 UNIT/2ML IM SUSY
1.2000 10*6.[IU] | PREFILLED_SYRINGE | Freq: Once | INTRAMUSCULAR | Status: AC
  Administered 2020-11-20: 1.2 10*6.[IU] via INTRAMUSCULAR

## 2020-11-20 NOTE — Discharge Instructions (Addendum)
You received an injection of antibiotics during your visit today to aggressively treat what is most likely a dental abscess in your upper left jaw.  Please begin taking penicillin 500 mg 3 times daily starting this evening, I have provided you with a 14-day prescription.  Please be sure you see your dentist soon as possible to address your cracked tooth, the antibiotics only fix the infection but do not correct the problem with a tooth and the tooth can become reinfected down the road if is not either repaired or removed.  Infections in the face and had can become very serious and can transmit to the brain, please to take this infection very seriously.  If you do not show significant improvement of your symptoms within the next 5 days or begin to have worsening swelling, fever greater than 101.5, increased pain or even drainage from the abscess, please go straight to the emergency room for evaluation and emergent treatment.

## 2020-11-20 NOTE — ED Provider Notes (Signed)
[facility]    CSN: [id number] Arrival date & time: 11/20/20  1327      History   Chief Complaint Chief Complaint  Patient presents with   Otalgia    HPI Brenda Mcintyre is a 45 y.o. female.   Patient complains of 3 weeks of intermittent swelling and pain on the left side of her face.  Patient states she is aware that she has a left upper molar that is cracked.  Patient states she is having ear pain, swelling of lymph nodes on her left jawline, states pain is keeping her awake at night.  Patient states she has tried Anbesol, applying a cottonball soaked with vanilla extract to the area, rinsing with Listerine, ibuprofen and even Benadryl with little relief.  Patient denies fever, aches, chills, difficulty swallowing, hoarse voice, congestion, loss of hearing, drainage from left ear, sinus pain or pressure.  Patient states that this morning when she woke up, she had some discharge from her left eye that resolved after few hours.  The history is provided by the patient.   Past Medical History:  Diagnosis Date   Anemia    Asthma    Hypertension     Patient Active Problem List   Diagnosis Date Noted   Vasovagal syncope 05/08/2017   Iron deficiency anemia 05/08/2017   THROMBOCYTOPENIA 03/29/2006   HYPERTENSION, BENIGN SYSTEMIC 03/29/2006   ASTHMA, EXERCISE INDUCED 03/29/2006   ECZEMA, ATOPIC DERMATITIS 03/29/2006    Past Surgical History:  Procedure Laterality Date   breast mass removed     CESAREAN SECTION     VAGINAL BIRTH AFTER CESAREAN SECTION      OB History   No obstetric history on file.      Home Medications    Prior to Admission medications   Medication Sig Start Date End Date Taking? Authorizing Provider  penicillin v potassium (VEETID) 500 MG tablet Take 1 tablet (500 mg total) by mouth 3 (three) times daily for 14 days. 11/20/20 12/04/20 Yes Theadora Rama Scales, PA-C    Family History Family History  Problem Relation Age of Onset    Diabetes Other    Diabetes Mother    Diabetes Maternal Grandmother    Hypertension Maternal Grandfather    Diabetes Maternal Grandfather     Social History Social History   Tobacco Use   Smoking status: Never   Smokeless tobacco: Never  Vaping Use   Vaping Use: Never used  Substance Use Topics   Alcohol use: Yes    Alcohol/week: 1.0 standard drink    Types: 1 Glasses of wine per week   Drug use: No     Allergies   Sulfa antibiotics and Latex   Review of Systems Review of Systems Pertinent findings noted in history of present illness.    Physical Exam Triage Vital Signs ED Triage Vitals [11/20/20 1507]  Enc Vitals Group     BP      Pulse      Resp      Temp      Temp src      SpO2      Weight      Height      Head Circumference      Peak Flow      Pain Score 7     Pain Loc      Pain Edu?      Excl. in GC?    No data found.  Updated Vital Signs BP (!) 166/80 (  BP Location: Right Arm) Comment (BP Location): large cuff, and has not picked up blood pressure medicines  Pulse 64   Temp 99 F (37.2 C) (Oral)   Resp (!) 22   LMP 11/14/2020 (Exact Date)   SpO2 100%   Visual Acuity Right Eye Distance:   Left Eye Distance:   Bilateral Distance:    Right Eye Near:   Left Eye Near:    Bilateral Near:     Physical Exam Vitals and nursing note reviewed.  Constitutional:      Appearance: Normal appearance.  HENT:     Head: Normocephalic and atraumatic.     Comments: Left-sided face lateral to nasolabial fold is diffusely edematous without induration or erythema, patient is nontender to palpation.    Right Ear: Tympanic membrane, ear canal and external ear normal.     Left Ear: Tympanic membrane, ear canal and external ear normal.     Nose: Nose normal.     Mouth/Throat:     Mouth: Mucous membranes are moist.     Pharynx: Oropharynx is clear.     Comments: Dental caries appreciated in left upper molars Eyes:     Extraocular Movements: Extraocular  movements intact.     Conjunctiva/sclera: Conjunctivae normal.     Pupils: Pupils are equal, round, and reactive to light.  Cardiovascular:     Rate and Rhythm: Normal rate and regular rhythm.     Pulses: Normal pulses.     Heart sounds: Normal heart sounds.  Pulmonary:     Effort: Pulmonary effort is normal.     Breath sounds: Normal breath sounds.  Musculoskeletal:     Cervical back: Normal range of motion and neck supple.  Lymphadenopathy:     Cervical: Cervical adenopathy (Left anterior superficial) present.  Neurological:     General: No focal deficit present.     Mental Status: She is alert and oriented to person, place, and time. Mental status is at baseline.  Psychiatric:        Mood and Affect: Mood normal.        Behavior: Behavior normal.     UC Treatments / Results  Labs (all labs ordered are listed, but only abnormal results are displayed) Labs Reviewed - No data to display  EKG   Radiology No results found.  Procedures Procedures (including critical care time)  Medications Ordered in UC Medications  penicillin g benzathine (BICILLIN LA) 1200000 UNIT/2ML injection 1.2 Million Units (has no administration in time range)    Initial Impression / Assessment and Plan / UC Course  I have reviewed the triage vital signs and the nursing notes.  Pertinent labs & imaging results that were available during my care of the patient were reviewed by me and considered in my medical decision making (see chart for details).     Patient was provided with an injection of Bicillin and a prescription for penicillin to treat a dental abscess which has been present for the past 2 weeks.  Patient was encouraged to make an appointment with a dentist soon as possible to have a cracked molar either repaired or removed.  Patient verbalized understanding and agreement of plan as discussed.  All questions were addressed during visit.  Please see discharge instructions below for  further details of plan.  Final Clinical Impressions(s) / UC Diagnoses   Final diagnoses:  Dental abscess     Discharge Instructions      You received an injection of antibiotics during your visit  today to aggressively treat what is most likely a dental abscess in your upper left jaw.  Please begin taking penicillin 500 mg 3 times daily starting this evening, I have provided you with a 14-day prescription.  Please be sure you see your dentist soon as possible to address your cracked tooth, the antibiotics only fix the infection but do not correct the problem with a tooth and the tooth can become reinfected down the road if is not either repaired or removed.  Infections in the face and had can become very serious and can transmit to the brain, please to take this infection very seriously.  If you do not show significant improvement of your symptoms within the next 5 days or begin to have worsening swelling, fever greater than 101.5, increased pain or even drainage from the abscess, please go straight to the emergency room for evaluation and emergent treatment.     ED Prescriptions     Medication Sig Dispense Auth. Provider   penicillin v potassium (VEETID) 500 MG tablet Take 1 tablet (500 mg total) by mouth 3 (three) times daily for 14 days. 42 tablet Theadora Rama Scales, PA-C      PDMP not reviewed this encounter.   Theadora Rama Scales, PA-C 11/20/20 1537

## 2020-11-20 NOTE — ED Triage Notes (Signed)
Symptoms intermittently for 3 weeks.  Left side of face pain:left ear, sore throat on left.    Has been using orajel;, seemed to help some. Reports swelling and pain to left side of face Patient has an ear bud in left ear.

## 2020-12-01 ENCOUNTER — Ambulatory Visit (INDEPENDENT_AMBULATORY_CARE_PROVIDER_SITE_OTHER): Payer: Self-pay | Admitting: Primary Care

## 2020-12-08 ENCOUNTER — Other Ambulatory Visit: Payer: Self-pay

## 2020-12-08 ENCOUNTER — Other Ambulatory Visit (INDEPENDENT_AMBULATORY_CARE_PROVIDER_SITE_OTHER): Payer: Self-pay | Admitting: Primary Care

## 2020-12-08 ENCOUNTER — Telehealth (INDEPENDENT_AMBULATORY_CARE_PROVIDER_SITE_OTHER): Payer: Self-pay

## 2020-12-08 DIAGNOSIS — F4323 Adjustment disorder with mixed anxiety and depressed mood: Secondary | ICD-10-CM

## 2020-12-08 DIAGNOSIS — I10 Essential (primary) hypertension: Secondary | ICD-10-CM

## 2020-12-08 MED ORDER — AMLODIPINE BESYLATE 10 MG PO TABS
10.0000 mg | ORAL_TABLET | Freq: Every day | ORAL | 3 refills | Status: DC
  Filled 2020-12-08: qty 90, 90d supply, fill #0

## 2020-12-08 MED ORDER — HYDROCHLOROTHIAZIDE 25 MG PO TABS
25.0000 mg | ORAL_TABLET | Freq: Every day | ORAL | 1 refills | Status: DC
  Filled 2020-12-08: qty 90, 90d supply, fill #0

## 2020-12-08 MED ORDER — LOSARTAN POTASSIUM 100 MG PO TABS
100.0000 mg | ORAL_TABLET | Freq: Every day | ORAL | 1 refills | Status: DC
  Filled 2020-12-08: qty 90, 90d supply, fill #0

## 2020-12-08 MED ORDER — FLUOXETINE HCL 20 MG PO CAPS
20.0000 mg | ORAL_CAPSULE | Freq: Every day | ORAL | 3 refills | Status: DC
  Filled 2020-12-08: qty 30, 30d supply, fill #0

## 2020-12-08 NOTE — Telephone Encounter (Signed)
CMA reviewed last OV notes. Notes list medications but nothing was actually sent to pharmacy. Please send all Rxs to [facility] pharmacy. Maryjean Morn, CMA    Copied from CRM [id number]. Topic: General - Other >> Dec 08, 2020  3:32 PM Traci Sermon wrote: Reason for CRM: Pt called in stating she spoke with PCP about her medication that was sent to [facility] pharmacy but when she contacted them they told her they did not see anything, pt requested if someone could give her a call to see about getting her blood pressure, and inhaler medication sent over, please advise.

## 2020-12-09 ENCOUNTER — Other Ambulatory Visit: Payer: Self-pay

## 2021-01-28 ENCOUNTER — Encounter (HOSPITAL_COMMUNITY): Payer: Self-pay

## 2021-01-28 ENCOUNTER — Other Ambulatory Visit: Payer: Self-pay

## 2021-01-28 ENCOUNTER — Ambulatory Visit (HOSPITAL_COMMUNITY)
Admission: EM | Admit: 2021-01-28 | Discharge: 2021-01-28 | Disposition: A | Discharge status: Home / Self Care | Payer: 59 | Attending: Emergency Medicine | Admitting: Emergency Medicine

## 2021-01-28 DIAGNOSIS — M533 Sacrococcygeal disorders, not elsewhere classified: Secondary | ICD-10-CM

## 2021-01-28 DIAGNOSIS — M5432 Sciatica, left side: Secondary | ICD-10-CM

## 2021-01-28 MED ORDER — KETOROLAC TROMETHAMINE 60 MG/2ML IM SOLN
60.0000 mg | Freq: Once | INTRAMUSCULAR | Status: AC
  Administered 2021-01-28: 19:00:00 60 mg via INTRAMUSCULAR

## 2021-01-28 MED ORDER — KETOROLAC TROMETHAMINE 60 MG/2ML IM SOLN
INTRAMUSCULAR | Status: AC
  Filled 2021-01-28: qty 2

## 2021-01-28 MED ORDER — BACLOFEN 10 MG PO TABS: 10.0000 mg | ORAL_TABLET | Freq: Every day | ORAL | 0 refills | Status: AC

## 2021-01-28 MED ORDER — METHYLPREDNISOLONE 4 MG PO TABS: ORAL_TABLET | ORAL | 0 refills | Status: AC

## 2021-01-28 NOTE — ED Provider Notes (Signed)
[facility]    CSN: [id number] Arrival date & time: 01/28/21  1805    HISTORY   Chief Complaint  Patient presents with   Back Pain   Leg Pain   HPI Brenda Mcintyre is a 45 y.o. female. Patient complains of lower back pain that radiates down her left leg, states has been present for about 5 days.  Patient states that her friend brought her a muscle relaxer to try along with a small round white pill that she does not know the name of.  Patient stated it made her very sleepy but did not really help with her pain.  Patient states she has difficulty getting comfortable lying down in bed, has extreme difficulty getting out of bed due to intense pain with movement.  Patient states she has discomfort sitting, standing, walking as well.  Patient denies falling, loss of motor coordination, loss of bowel or bladder function/control.  Denies history of trauma to lower back, known history of osteoarthritis, degenerative joint or degenerative disc disease.  Patient denies numbness or tingling in left lower extremity.  Patient states had a similar episode about a year ago, states she went to a massage therapist for several visits, ultimately did feel better.  Patient states that a few days ago, she was helping her mother get a kerosene can out of her trunk, lifted it and did not realize how heavy it was, states it can fill taking her arm down with it, states initially she did not have any pain but later that night when she was lying in bed she said it began to be sore and that by the morning she could barely move, that was 5 days ago.  The history is provided by the patient.  Past Medical History:  Diagnosis Date   Anemia    Asthma    Hypertension    Patient Active Problem List   Diagnosis Date Noted   Vasovagal syncope 05/08/2017   Iron deficiency anemia 05/08/2017   THROMBOCYTOPENIA 03/29/2006   HYPERTENSION, BENIGN SYSTEMIC 03/29/2006   ASTHMA, EXERCISE INDUCED 03/29/2006    ECZEMA, ATOPIC DERMATITIS 03/29/2006   Past Surgical History:  Procedure Laterality Date   breast mass removed     CESAREAN SECTION     VAGINAL BIRTH AFTER CESAREAN SECTION     OB History   No obstetric history on file.    Home Medications    Prior to Admission medications   Medication Sig Start Date End Date Taking? Authorizing Provider  amLODipine (NORVASC) 10 MG tablet Take 1 tablet (10 mg total) by mouth daily. 12/08/20   Grayce Sessions, NP  FLUoxetine (PROZAC) 20 MG capsule Take 1 capsule (20 mg total) by mouth daily. 12/08/20   Grayce Sessions, NP  hydrochlorothiazide (HYDRODIURIL) 25 MG tablet Take 1 tablet (25 mg total) by mouth daily. 12/08/20   Grayce Sessions, NP  losartan (COZAAR) 100 MG tablet Take 1 tablet (100 mg total) by mouth daily. 12/08/20   Grayce Sessions, NP    Family History Family History  Problem Relation Age of Onset   Diabetes Other    Diabetes Mother    Diabetes Maternal Grandmother    Hypertension Maternal Grandfather    Diabetes Maternal Grandfather    Social History Social History   Tobacco Use   Smoking status: Never   Smokeless tobacco: Never  Vaping Use   Vaping Use: Never used  Substance Use Topics   Alcohol use: Yes  Alcohol/week: 1.0 standard drink    Types: 1 Glasses of wine per week   Drug use: No   Allergies   Sulfa antibiotics and Latex  Review of Systems Review of Systems Pertinent findings noted in history of present illness.   Physical Exam Triage Vital Signs ED Triage Vitals  Enc Vitals Group     BP 11/26/20 0827 (!) 147/82     Pulse Rate 11/26/20 0827 72     Resp 11/26/20 0827 18     Temp 11/26/20 0827 98.3 F (36.8 C)     Temp Source 11/26/20 0827 Oral     SpO2 11/26/20 0827 98 %     Weight --      Height --      Head Circumference --      Peak Flow --      Pain Score 11/26/20 0826 5     Pain Loc --      Pain Edu? --      Excl. in GC? --   No data found.  Updated Vital Signs BP (!)  187/104 (BP Location: Right Arm)    Pulse 82    Temp 98.3 F (36.8 C) (Oral)    Resp 18    LMP 01/12/2021    SpO2 98%   Physical Exam Vitals and nursing note reviewed.  Constitutional:      General: She is not in acute distress.    Appearance: Normal appearance. She is not ill-appearing.  HENT:     Head: Normocephalic and atraumatic.  Eyes:     General: Lids are normal.        Right eye: No discharge.        Left eye: No discharge.     Extraocular Movements: Extraocular movements intact.     Conjunctiva/sclera: Conjunctivae normal.     Right eye: Right conjunctiva is not injected.     Left eye: Left conjunctiva is not injected.  Neck:     Trachea: Trachea and phonation normal.  Cardiovascular:     Rate and Rhythm: Normal rate and regular rhythm.     Pulses: Normal pulses.     Heart sounds: Normal heart sounds. No murmur heard.   No friction rub. No gallop.  Pulmonary:     Effort: Pulmonary effort is normal. No accessory muscle usage, prolonged expiration or respiratory distress.     Breath sounds: Normal breath sounds. No stridor, decreased air movement or transmitted upper airway sounds. No decreased breath sounds, wheezing, rhonchi or rales.  Chest:     Chest wall: No tenderness.  Musculoskeletal:     Cervical back: Normal range of motion and neck supple. Normal range of motion.     Lumbar back: Spasms and tenderness present. No swelling, edema, deformity, signs of trauma, lacerations or bony tenderness. Normal range of motion. Positive left straight leg raise test. Negative right straight leg raise test. No scoliosis.  Lymphadenopathy:     Cervical: No cervical adenopathy.  Skin:    General: Skin is warm and dry.     Findings: No erythema or rash.  Neurological:     General: No focal deficit present.     Mental Status: She is alert and oriented to person, place, and time.  Psychiatric:        Mood and Affect: Mood normal.        Behavior: Behavior normal.    Visual  Acuity Right Eye Distance:   Left Eye Distance:   Bilateral Distance:  Right Eye Near:   Left Eye Near:    Bilateral Near:     UC Couse / Diagnostics / Procedures:    EKG  Radiology No results found.  Procedures Procedures (including critical care time)  UC Diagnoses / Final Clinical Impressions(s)   I have reviewed the triage vital signs and the nursing notes.  Pertinent labs & imaging results that were available during my care of the patient were reviewed by me and considered in my medical decision making (see chart for details).    Final diagnoses:  Sciatica of left side  SI (sacroiliac) joint dysfunction   Patient was provided with injection of ketorolac in the office today, advised to begin baclofen tonight and start steroid taper in the morning.  Patient advised can return to urgent care in 3 to 4 days for repeat ketorolac injection if needed.  ED Prescriptions     Medication Sig Dispense Auth. Provider   methylPREDNISolone (MEDROL) 4 MG tablet Take 8 tablets (32 mg total) by mouth daily for 1 day, THEN 7 tablets (28 mg total) daily for 1 day, THEN 6 tablets (24 mg total) daily for 1 day, THEN 5 tablets (20 mg total) daily for 1 day, THEN 4 tablets (16 mg total) daily for 1 day, THEN 3 tablets (12 mg total) daily for 1 day, THEN 2 tablets (8 mg total) daily for 1 day, THEN 1 tablet (4 mg total) daily for 1 day. 36 tablet Theadora Rama Scales, PA-C   baclofen (LIORESAL) 10 MG tablet Take 1 tablet (10 mg total) by mouth at bedtime for 7 days. 7 tablet Theadora Rama Scales, PA-C      PDMP not reviewed this encounter.  Pending results:  Labs Reviewed - No data to display  Medications Ordered in UC: Medications  ketorolac (TORADOL) injection 60 mg (has no administration in time range)    Disposition Upon Discharge:  Condition: stable for discharge home Home: take medications as prescribed; routine discharge instructions as discussed; follow up as  advised.  Patient presented with an acute illness with associated systemic symptoms and significant discomfort requiring urgent management. In my opinion, this is a condition that a prudent lay person (someone who possesses an average knowledge of health and medicine) may potentially expect to result in complications if not addressed urgently such as respiratory distress, impairment of bodily function or dysfunction of bodily organs.   Routine symptom specific, illness specific and/or disease specific instructions were discussed with the patient and/or caregiver at length.   As such, the patient has been evaluated and assessed, work-up was performed and treatment was provided in alignment with urgent care protocols and evidence based medicine.  Patient/parent/caregiver has been advised that the patient may require follow up for further testing and treatment if the symptoms continue in spite of treatment, as clinically indicated and appropriate.  If the patient was tested for COVID-19, Influenza and/or RSV, then the patient/parent/guardian was advised to isolate at home pending the results of his/her diagnostic coronavirus test and potentially longer if theyre positive. I have also advised pt that if his/her COVID-19 test returns positive, it's recommended to self-isolate for at least 10 days after symptoms first appeared AND until fever-free for 24 hours without fever reducer AND other symptoms have improved or resolved. Discussed self-isolation recommendations as well as instructions for household member/close contacts as per the [facility] and [place] [facility], and also gave patient the COVID packet with this information.  Patient/parent/caregiver has been advised to return to the  [facility] or PCP in 3-5 days if no better; to PCP or the Emergency Department if new signs and symptoms develop, or if the current signs or symptoms continue to change or worsen for further workup, evaluation and treatment as clinically indicated  and appropriate  The patient will follow up with their current PCP if and as advised. If the patient does not currently have a PCP we will assist them in obtaining one.   The patient may need specialty follow up if the symptoms continue, in spite of conservative treatment and management, for further workup, evaluation, consultation and treatment as clinically indicated and appropriate.   Patient/parent/caregiver verbalized understanding and agreement of plan as discussed.  All questions were addressed during visit.  Please see discharge instructions below for further details of plan.  Discharge Instructions:   Discharge Instructions      Please see the enclosed information about sciatica and rehabilitation after an acute episode of sciatica.  You were provided with an injection of ketorolac in the office today.  This should provide you with significant pain relief for the next 6 to 8 hours, just enough time to get over to your pharmacy and pick up your prescriptions for methylprednisolone and baclofen.  Methylprednisolone is a steroid that you will take a tapering dose over the next 8 days, please take it exactly as prescribed in the morning with your breakfast meal.  Baclofen is a very good antispasmodic, antispasm medication that would like for you to take at bedtime.  Please take this medication about an hour before you plan to go to bed to the daytime you lie down your muscles are relaxed and it is easy to find a comfortable position.  Please consider returning to urgent care at the [facility] location, [phone] next Monday or Tuesday if you feel that you would benefit from a repeat injection of ketorolac.  Many patients find this beneficial.  Thank you for visiting urgent care this evening, thank you for waiting.  I hope you feel better soon.      This office note has been dictated using Teaching laboratory technician.  Unfortunately, and despite my best  efforts, this method of dictation can sometimes lead to occasional typographical or grammatical errors.  I apologize in advance if this occurs.     Theadora Rama Scales, PA-C 01/28/21 1901

## 2021-01-28 NOTE — ED Triage Notes (Signed)
Pt presents with lower back pain that radiates down left leg X 5 days.

## 2021-01-28 NOTE — Discharge Instructions (Signed)
Please see the enclosed information about sciatica and rehabilitation after an acute episode of sciatica.  You were provided with an injection of ketorolac in the office today.  This should provide you with significant pain relief for the next 6 to 8 hours, just enough time to get over to your pharmacy and pick up your prescriptions for methylprednisolone and baclofen.  Methylprednisolone is a steroid that you will take a tapering dose over the next 8 days, please take it exactly as prescribed in the morning with your breakfast meal.  Baclofen is a very good antispasmodic, antispasm medication that would like for you to take at bedtime.  Please take this medication about an hour before you plan to go to bed to the daytime you lie down your muscles are relaxed and it is easy to find a comfortable position.  Please consider returning to urgent care at the [facility] location, [phone] next Monday or Tuesday if you feel that you would benefit from a repeat injection of ketorolac.  Many patients find this beneficial.  Thank you for visiting urgent care this evening, thank you for waiting.  I hope you feel better soon.

## 2021-02-16 ENCOUNTER — Ambulatory Visit (INDEPENDENT_AMBULATORY_CARE_PROVIDER_SITE_OTHER): Payer: Self-pay | Admitting: Primary Care

## 2023-02-13 ENCOUNTER — Encounter (HOSPITAL_COMMUNITY): Payer: Self-pay | Admitting: Emergency Medicine

## 2023-02-13 ENCOUNTER — Emergency Department (HOSPITAL_COMMUNITY): Payer: Self-pay

## 2023-02-13 ENCOUNTER — Ambulatory Visit (INDEPENDENT_AMBULATORY_CARE_PROVIDER_SITE_OTHER): Payer: Self-pay

## 2023-02-13 ENCOUNTER — Other Ambulatory Visit: Payer: Self-pay

## 2023-02-13 ENCOUNTER — Emergency Department (HOSPITAL_COMMUNITY)
Admission: EM | Admit: 2023-02-13 | Discharge: 2023-02-14 | Discharge status: Against Medical Advice | Payer: BC Managed Care – PPO | Attending: Emergency Medicine | Admitting: Emergency Medicine

## 2023-02-13 DIAGNOSIS — Z20822 Contact with and (suspected) exposure to covid-19: Secondary | ICD-10-CM | POA: Insufficient documentation

## 2023-02-13 DIAGNOSIS — R509 Fever, unspecified: Secondary | ICD-10-CM | POA: Diagnosis not present

## 2023-02-13 DIAGNOSIS — R519 Headache, unspecified: Secondary | ICD-10-CM | POA: Diagnosis not present

## 2023-02-13 DIAGNOSIS — R002 Palpitations: Secondary | ICD-10-CM | POA: Insufficient documentation

## 2023-02-13 DIAGNOSIS — Z5321 Procedure and treatment not carried out due to patient leaving prior to being seen by health care provider: Secondary | ICD-10-CM | POA: Insufficient documentation

## 2023-02-13 DIAGNOSIS — R079 Chest pain, unspecified: Secondary | ICD-10-CM | POA: Diagnosis present

## 2023-02-13 DIAGNOSIS — M791 Myalgia, unspecified site: Secondary | ICD-10-CM | POA: Diagnosis not present

## 2023-02-13 DIAGNOSIS — R0602 Shortness of breath: Secondary | ICD-10-CM | POA: Insufficient documentation

## 2023-02-13 LAB — BASIC METABOLIC PANEL
Anion gap: 10 (ref 5–15)
BUN: 12 mg/dL (ref 6–20)
CO2: 27 mmol/L (ref 22–32)
Calcium: 9.5 mg/dL (ref 8.9–10.3)
Chloride: 101 mmol/L (ref 98–111)
Creatinine, Ser: 0.87 mg/dL (ref 0.44–1.00)
GFR, Estimated: >60 mL/min (ref >60)
Glucose, Bld: 110 mg/dL — ABNORMAL HIGH (ref 70–99)
Potassium: 4.6 mmol/L (ref 3.5–5.1)
Sodium: 138 mmol/L (ref 135–145)

## 2023-02-13 LAB — CBC
HCT: 33.2 % — ABNORMAL LOW (ref 36.0–46.0)
Hemoglobin: 8.5 g/dL — ABNORMAL LOW (ref 12.0–15.0)
MCH: 16.5 pg — ABNORMAL LOW (ref 26.0–34.0)
MCHC: 25.6 g/dL — ABNORMAL LOW (ref 30.0–36.0)
MCV: 64.5 fL — ABNORMAL LOW (ref 80.0–100.0)
Platelets: 259 10*3/uL (ref 150–400)
RBC: 5.15 MIL/uL — ABNORMAL HIGH (ref 3.87–5.11)
RDW: 18.6 % — ABNORMAL HIGH (ref 11.5–15.5)
WBC: 5.3 10*3/uL (ref 4.0–10.5)
nRBC: 0 % (ref 0.0–0.2)

## 2023-02-13 LAB — RESP PANEL BY RT-PCR (RSV, FLU A&B, COVID)  RVPGX2
Influenza A by PCR: NEGATIVE
Influenza B by PCR: NEGATIVE
Resp Syncytial Virus by PCR: NEGATIVE
SARS Coronavirus 2 by RT PCR: NEGATIVE

## 2023-02-13 LAB — TROPONIN I (HIGH SENSITIVITY): Troponin I (High Sensitivity): 8 ng/L (ref <18)

## 2023-02-13 LAB — HCG, SERUM, QUALITATIVE: Preg, Serum: NEGATIVE

## 2023-02-13 NOTE — ED Triage Notes (Signed)
 Patient complaining of centralized chest pain and palpitations. Patient describes pain and squeezing and feels like her heart is doing flips. Endorses shortness of breath. Patient also complaining of cold like symptoms this past week with generalized body aches, headache, and fever.

## 2023-02-13 NOTE — Telephone Encounter (Signed)
  Chief Complaint: SOB Symptoms: SOB, inhalers expired, cough Frequency: several days, recently had cold  Pertinent Negatives: NA Disposition: [] ED /[] Urgent Care (no appt availability in office) / [] Appointment(In office/virtual)/ []  [facility]/ [] Home Care/ [] Refused Recommended Disposition /[x] [facility]/ []  Follow-up with PCP Additional Notes: pt states she is needing her medications refilled since they are all expired. She is needing Albuterol  and Advair  inhalers and HTN meds as well. No appts until 02/26/23, advised of [facility], provided location details for today or tomorrow. Pt states she will try to get a ride. Also wanted to schedule OV for FU. Appt scheduled for 02/26/23 1610 with PCP.   Reason for Disposition  [1] MILD difficulty breathing (e.g., minimal/no SOB at rest, SOB with walking, pulse <100) AND [2] NEW-onset or WORSE than normal  Answer Assessment - Initial Assessment Questions 1. RESPIRATORY STATUS: Describe your breathing? (e.g., wheezing, shortness of breath, unable to speak, severe coughing)      SOB  2. ONSET: When did this breathing problem begin?      Several days, recently had cold  3. PATTERN Does the difficult breathing come and go, or has it been constant since it started?      Comes and goes  4. SEVERITY: How bad is your breathing? (e.g., mild, moderate, severe)    - MILD: No SOB at rest, mild SOB with walking, speaks normally in sentences, can lie down, no retractions, pulse < 100.    - MODERATE: SOB at rest, SOB with minimal exertion and prefers to sit, cannot lie down flat, speaks in phrases, mild retractions, audible wheezing, pulse 100-120.    - SEVERE: Very SOB at rest, speaks in single words, struggling to breathe, sitting hunched forward, retractions, pulse > 120      Mild  5. RECURRENT SYMPTOM: Have you had difficulty breathing before? If Yes, ask: When was the last time? and What happened that time?      Uses inhaler  but expired  6. CARDIAC HISTORY: Do you have any history of heart disease? (e.g., heart attack, angina, bypass surgery, angioplasty)      HTN 7. LUNG HISTORY: Do you have any history of lung disease?  (e.g., pulmonary embolus, asthma, emphysema)     Asthma  8. CAUSE: What do you think is causing the breathing problem?      Out of medications  9. OTHER SYMPTOMS: Do you have any other symptoms? (e.g., dizziness, runny nose, cough, chest pain, fever)     Cough  Protocols used: Breathing Difficulty-A-AH

## 2023-02-14 LAB — TROPONIN I (HIGH SENSITIVITY): Troponin I (High Sensitivity): 9 ng/L (ref <18)

## 2023-02-14 NOTE — ED Notes (Signed)
Pt has decided to leave and not be seen.

## 2023-02-15 ENCOUNTER — Encounter (HOSPITAL_COMMUNITY): Payer: Self-pay

## 2023-02-15 ENCOUNTER — Telehealth (INDEPENDENT_AMBULATORY_CARE_PROVIDER_SITE_OTHER): Payer: Self-pay | Admitting: Primary Care

## 2023-02-15 ENCOUNTER — Telehealth (INDEPENDENT_AMBULATORY_CARE_PROVIDER_SITE_OTHER): Payer: Self-pay

## 2023-02-15 ENCOUNTER — Ambulatory Visit (HOSPITAL_COMMUNITY)
Admission: EM | Admit: 2023-02-15 | Discharge: 2023-02-15 | Disposition: A | Discharge status: Home / Self Care | Payer: BC Managed Care – PPO | Attending: Internal Medicine | Admitting: Internal Medicine

## 2023-02-15 DIAGNOSIS — I10 Essential (primary) hypertension: Secondary | ICD-10-CM

## 2023-02-15 DIAGNOSIS — D508 Other iron deficiency anemias: Secondary | ICD-10-CM | POA: Diagnosis not present

## 2023-02-15 DIAGNOSIS — J4520 Mild intermittent asthma, uncomplicated: Secondary | ICD-10-CM | POA: Diagnosis not present

## 2023-02-15 MED ORDER — AMLODIPINE BESYLATE 10 MG PO TABS: 10.0000 mg | ORAL_TABLET | Freq: Every day | ORAL | 1 refills | Status: DC

## 2023-02-15 MED ORDER — ALBUTEROL SULFATE HFA 108 (90 BASE) MCG/ACT IN AERS: 2.0000 | INHALATION_SPRAY | RESPIRATORY_TRACT | 1 refills | Status: DC | PRN

## 2023-02-15 MED ORDER — FLUTICASONE-SALMETEROL 100-50 MCG/ACT IN AEPB: 1.0000 | INHALATION_SPRAY | Freq: Two times a day (BID) | RESPIRATORY_TRACT | 1 refills | Status: DC

## 2023-02-15 NOTE — Telephone Encounter (Signed)
Pt came into the office because she received a notification through mychart in regards to her labs.  Unfortunately not able to provide lab results from the hospital.   Per Marcelino Duster she looked at labs and recommend taking iron pill 325mg  once a day and because pt has seen provider in 2 years pt is considered a new pt and has been schedule

## 2023-02-15 NOTE — ED Triage Notes (Addendum)
Pt states seen in ED 2 days ago and left d/t wait time. States was trying to get her lab results and ED sent her here. Pt states still having SOB on exertion at time. States out of her meds for year, needs a refill.

## 2023-02-15 NOTE — ED Provider Notes (Signed)
[facility]    CSN: [id number] Arrival date & time: 02/15/23  1641      History   Chief Complaint Chief Complaint  Patient presents with   Shortness of Breath    HPI TAJHA MCCLURKIN is a 48 y.o. female who presents to FU on ER labs she had done yesterday, but could not wait to be seen since the wait was so long. She developed flu like symptoms this past weekend and her asthma was acting up, but she only could use her old inhaler. She was in bed for 3 days since she aches all over and was so fatigued til she left the house yesterday. The only symptoms she has left is mild post nasal drainage. She is also out of her BP medication and would like a refill.  Has appointment with new PCP in March. She has history of anemia and had had Fe infusion once. She can't tolerate the Oral Fe well.     Past Medical History:  Diagnosis Date   Anemia    Asthma    Hypertension     Patient Active Problem List   Diagnosis Date Noted   Vasovagal syncope 05/08/2017   Iron deficiency anemia 05/08/2017   THROMBOCYTOPENIA 03/29/2006   HYPERTENSION, BENIGN SYSTEMIC 03/29/2006   ASTHMA, EXERCISE INDUCED 03/29/2006   ECZEMA, ATOPIC DERMATITIS 03/29/2006    Past Surgical History:  Procedure Laterality Date   breast mass removed     CESAREAN SECTION     VAGINAL BIRTH AFTER CESAREAN SECTION      OB History   No obstetric history on file.      Home Medications    Prior to Admission medications   Medication Sig Start Date End Date Taking? Authorizing Provider  albuterol (VENTOLIN HFA) 108 (90 Base) MCG/ACT inhaler Inhale 2 puffs into the lungs every 4 (four) hours as needed for wheezing or shortness of breath. 02/15/23  Yes Rodriguez-Southworth, Nettie Elm, PA-C  amLODipine (NORVASC) 10 MG tablet Take 1 tablet (10 mg total) by mouth daily. 02/15/23   Rodriguez-Southworth, Nettie Elm, PA-C  fluticasone-salmeterol (ADVAIR DISKUS) 100-50 MCG/ACT AEPB Inhale 1 puff into the lungs 2 (two)  times daily. 02/15/23  Yes Rodriguez-Southworth, Nettie Elm, PA-C    Family History Family History  Problem Relation Age of Onset   Diabetes Other    Diabetes Mother    Diabetes Maternal Grandmother    Hypertension Maternal Grandfather    Diabetes Maternal Grandfather     Social History Social History   Tobacco Use   Smoking status: Never   Smokeless tobacco: Never  Vaping Use   Vaping status: Never Used  Substance Use Topics   Alcohol use: Yes    Alcohol/week: 1.0 standard drink of alcohol    Types: 1 Glasses of wine per week   Drug use: No     Allergies   Sulfa antibiotics and Latex   Review of Systems Review of Systems As noted in HPI   Physical Exam Triage Vital Signs ED Triage Vitals  Encounter Vitals Group     BP 02/15/23 1726 (!) 140/95     Systolic BP Percentile --      Diastolic BP Percentile --      Pulse Rate 02/15/23 1726 87     Resp 02/15/23 1726 18     Temp 02/15/23 1726 98.2 F (36.8 C)     Temp Source 02/15/23 1726 Oral     SpO2 02/15/23 1726 97 %     Weight --  Height --      Head Circumference --      Peak Flow --      Pain Score 02/15/23 1728 0     Pain Loc --      Pain Education --      Exclude from Growth Chart --    No data found.  Updated Vital Signs BP (!) 140/95 (BP Location: Left Arm)   Pulse 87   Temp 98.2 F (36.8 C) (Oral)   Resp 18   SpO2 97%   Visual Acuity Right Eye Distance:   Left Eye Distance:   Bilateral Distance:    Right Eye Near:   Left Eye Near:    Bilateral Near:     Physical Exam Vitals reviewed.  Constitutional:      General: She is not in acute distress.    Appearance: She is obese. She is not toxic-appearing.  HENT:     Right Ear: External ear normal.     Left Ear: External ear normal.  Eyes:     General: No scleral icterus.    Conjunctiva/sclera: Conjunctivae normal.  Cardiovascular:     Rate and Rhythm: Normal rate and regular rhythm.     Heart sounds: No murmur heard. Pulmonary:      Effort: Pulmonary effort is normal.     Breath sounds: Normal breath sounds. No wheezing, rhonchi or rales.  Musculoskeletal:        General: Normal range of motion.     Cervical back: Neck supple.  Skin:    General: Skin is warm and dry.  Neurological:     Mental Status: She is alert and oriented to person, place, and time.     Gait: Gait normal.  Psychiatric:        Mood and Affect: Mood normal.        Behavior: Behavior normal.        Thought Content: Thought content normal.        Judgment: Judgment normal.      UC Treatments / Results  Labs (all labs ordered are listed, but only abnormal results are displayed) Labs Reviewed - No data to display  EKG   Radiology DG Chest 2 View Result Date: 02/13/2023 CLINICAL DATA:  Chest pain and congestion. EXAM: CHEST - 2 VIEW COMPARISON:  November 23, 2015 FINDINGS: The heart size and mediastinal contours are within normal limits. Both lungs are clear. The visualized skeletal structures are unremarkable. IMPRESSION: No active cardiopulmonary disease. Electronically Signed   By: Aram Candela M.D.   On: 02/13/2023 23:03    Procedures Procedures (including critical care time)  Medications Ordered in UC Medications - No data to display  Initial Impression / Assessment and Plan / UC Course  I have reviewed the triage vital signs and the nursing notes.  Pertinent labs & imaging results that were available during my care of the patient were reviewed by me and considered in my medical decision making (see chart for details).  I reviewed her labs from last night and all were unremarkable except she is Fe deficient anemic.  I refilled her Amlodipine, Albuterol inhaler, Advair as noted until March.  Advised to try Fe with probiotic over the counter in the mean time.    Final Clinical Impressions(s) / UC Diagnoses   Final diagnoses:  Essential hypertension  Mild intermittent asthma without complication  Other iron  deficiency anemia     Discharge Instructions      Start on iron called :  Probi Ferrocorb and take it as directed      ED Prescriptions     Medication Sig Dispense Auth. Provider   albuterol (VENTOLIN HFA) 108 (90 Base) MCG/ACT inhaler Inhale 2 puffs into the lungs every 4 (four) hours as needed for wheezing or shortness of breath. 18 g Rodriguez-Southworth, Braxtyn Bojarski, PA-C   fluticasone-salmeterol (ADVAIR DISKUS) 100-50 MCG/ACT AEPB Inhale 1 puff into the lungs 2 (two) times daily. 60 each Rodriguez-Southworth, Nan Maya, PA-C   amLODipine (NORVASC) 10 MG tablet Take 1 tablet (10 mg total) by mouth daily. 30 tablet Rodriguez-Southworth, Nettie Elm, PA-C      PDMP not reviewed this encounter.   Garey Ham, [place] 02/15/23 1805

## 2023-02-15 NOTE — Telephone Encounter (Signed)
Pt came in wanting to know about Mychart labs. She was instructed by the hospital to come up to [facility] and the nurse would read her labs. She was able to speak with the nurse and we were able to sch an atp for her.

## 2023-02-15 NOTE — Discharge Instructions (Addendum)
Start on iron called : Probi Ferrocorb and take it as directed

## 2023-02-26 ENCOUNTER — Ambulatory Visit (INDEPENDENT_AMBULATORY_CARE_PROVIDER_SITE_OTHER): Payer: Self-pay | Admitting: Primary Care

## 2023-04-24 ENCOUNTER — Telehealth (INDEPENDENT_AMBULATORY_CARE_PROVIDER_SITE_OTHER): Payer: Self-pay | Admitting: Primary Care

## 2023-04-24 NOTE — Telephone Encounter (Signed)
 Left VM with pt about their upcoming appt.

## 2023-04-25 ENCOUNTER — Encounter (INDEPENDENT_AMBULATORY_CARE_PROVIDER_SITE_OTHER): Payer: Self-pay | Admitting: Primary Care

## 2023-04-25 ENCOUNTER — Ambulatory Visit (INDEPENDENT_AMBULATORY_CARE_PROVIDER_SITE_OTHER): Payer: Self-pay | Admitting: Primary Care

## 2023-04-25 ENCOUNTER — Other Ambulatory Visit (HOSPITAL_COMMUNITY)
Admission: RE | Admit: 2023-04-25 | Discharge: 2023-04-25 | Disposition: A | Discharge status: Home / Self Care | Source: Ambulatory Visit | Attending: Primary Care | Admitting: Primary Care

## 2023-04-25 VITALS — BP 164/86 | HR 78 | Ht 66.0 in | Wt 253.2 lb

## 2023-04-25 DIAGNOSIS — I10 Essential (primary) hypertension: Secondary | ICD-10-CM | POA: Diagnosis not present

## 2023-04-25 DIAGNOSIS — D509 Iron deficiency anemia, unspecified: Secondary | ICD-10-CM | POA: Diagnosis not present

## 2023-04-25 DIAGNOSIS — N898 Other specified noninflammatory disorders of vagina: Secondary | ICD-10-CM

## 2023-04-25 DIAGNOSIS — R8290 Unspecified abnormal findings in urine: Secondary | ICD-10-CM | POA: Diagnosis not present

## 2023-04-25 DIAGNOSIS — J45990 Exercise induced bronchospasm: Secondary | ICD-10-CM | POA: Diagnosis not present

## 2023-04-25 DIAGNOSIS — Z7689 Persons encountering health services in other specified circumstances: Secondary | ICD-10-CM

## 2023-04-25 LAB — POCT URINALYSIS DIP (CLINITEK)
Bilirubin, UA: NEGATIVE
Blood, UA: NEGATIVE
Glucose, UA: NEGATIVE mg/dL
Ketones, POC UA: NEGATIVE mg/dL
Leukocytes, UA: NEGATIVE
Nitrite, UA: NEGATIVE
POC PROTEIN,UA: 30 — AB
Spec Grav, UA: 1.025 (ref 1.010–1.025)
Urobilinogen, UA: 1 U/dL
pH, UA: 6.5 (ref 5.0–8.0)

## 2023-04-25 MED ORDER — LOSARTAN POTASSIUM 25 MG PO TABS: 25.0000 mg | ORAL_TABLET | Freq: Every day | ORAL | 1 refills | Status: DC

## 2023-04-25 NOTE — Progress Notes (Signed)
 New Patient Office Visit  Subjective    Patient ID: Brenda Mcintyre female  DOB: March 03, 1975  Age: 48 y.o. MRN: [id number]   CC:  Elevated Bp  HPI     New Patient (Initial Visit)    Additional comments: Patient states she wants to change bp back to losartan due to leg swelling       Last edited by Dalene Carrow I, CMA on 04/25/2023  9:15 AM.      HPI  Brenda Mcintyre is a 48 year old morbid obese female.  She has a history of hypertension and amlodipine did not do well for her she started experiencing bilateral lower extremity edema and pain. Denies shortness of breath, headaches, chest pain or lower extremity edema, sudden onset, vision changes, unilateral weakness, dizziness, paresthesias normal.  Previously, diagnosed with OSA and noncompliant with CPAP usage discussed apnea and the reason for the machine.  She is requesting to see a pulmonologist for her shortness of breath with exertion and asthma. Current Outpatient Medications on File Prior to Visit  Medication Sig Dispense Refill   albuterol (VENTOLIN HFA) 108 (90 Base) MCG/ACT inhaler Inhale 2 puffs into the lungs every 4 (four) hours as needed for wheezing or shortness of breath. 18 g 1   amLODipine (NORVASC) 10 MG tablet Take 1 tablet (10 mg total) by mouth daily. 30 tablet 1   fluticasone-salmeterol (ADVAIR DISKUS) 100-50 MCG/ACT AEPB Inhale 1 puff into the lungs 2 (two) times daily. 60 each 1   No current facility-administered medications on file prior to visit.     Allergies  Allergen Reactions   Sulfa Antibiotics Hives    From childhood    Latex Rash and Other (See Comments)    Skin peels    Past Medical History:  Diagnosis Date   Anemia    Asthma    Hypertension      Past Surgical History:  Procedure Laterality Date   breast mass removed     CESAREAN SECTION     VAGINAL BIRTH AFTER CESAREAN SECTION       Family History  Problem Relation Age of Onset   Diabetes Other    Diabetes  Mother    Diabetes Maternal Grandmother    Hypertension Maternal Grandfather    Diabetes Maternal Grandfather     Social History   Socioeconomic History   Marital status: Single    Spouse name: Not on file   Number of children: Not on file   Years of education: Not on file   Highest education level: Not on file  Occupational History   Not on file  Tobacco Use   Smoking status: Never   Smokeless tobacco: Never  Vaping Use   Vaping status: Never Used  Substance and Sexual Activity   Alcohol use: Not Currently    Alcohol/week: 1.0 standard drink of alcohol    Types: 1 Glasses of wine per week    Comment: occassionally   Drug use: No   Sexual activity: Not Currently  Other Topics Concern   Not on file  Social History Narrative   Not on file   Social Drivers of Health   Financial Resource Strain: Not on file  Food Insecurity: No Food Insecurity (04/25/2023)   Hunger Vital Sign    Worried About Running Out of Food in the Last Year: Never true    Ran Out of Food in the Last Year: Never true  Transportation Needs: No Transportation Needs (04/25/2023)   PRAPARE -  Administrator, Civil Service (Medical): No    Lack of Transportation (Non-Medical): No  Physical Activity: Not on file  Stress: Not on file  Social Connections: Not on file  Intimate Partner Violence: Not on file    SDOH Interventions Today    Flowsheet Row Most Recent Value  SDOH Interventions   Food Insecurity Interventions Intervention Not Indicated  Housing Interventions Intervention Not Indicated  Transportation Interventions Intervention Not Indicated  Utilities Interventions Intervention Not Indicated      Health Maintenance  Topic Date Due   Pneumococcal Vaccination (1 of 2 - PCV) Never done   Hepatitis C Screening  Never done   Pap with HPV screening  04/04/2020   Colon Cancer Screening  Never done   Flu Shot  Never done   COVID-19 Vaccine (1 - 2024-25 season) Never done    DTaP/Tdap/Td vaccine (3 - Td or Tdap) 09/01/2026   HIV Screening  Completed   HPV Vaccine  Aged Out    Objective    BP (!) 164/86 (BP Location: Left Arm, Patient Position: Sitting, Cuff Size: Normal)   Pulse 78   Ht 5\' 6"  (1.676 m)   Wt 253 lb 3.2 oz (114.9 kg)   LMP 04/02/2023   SpO2 98%   BMI 40.87 kg/m   Physical Exam Vitals reviewed.  Constitutional:      Appearance: Normal appearance. She is obese.     Comments: morbid  HENT:     Head: Normocephalic.     Right Ear: Tympanic membrane, ear canal and external ear normal.     Left Ear: Tympanic membrane, ear canal and external ear normal.     Nose: Nose normal.     Mouth/Throat:     Mouth: Mucous membranes are moist.  Eyes:     Extraocular Movements: Extraocular movements intact.     Pupils: Pupils are equal, round, and reactive to light.  Cardiovascular:     Rate and Rhythm: Normal rate.  Pulmonary:     Effort: Pulmonary effort is normal.     Breath sounds: Normal breath sounds.  Abdominal:     General: Bowel sounds are normal.     Palpations: Abdomen is soft.  Musculoskeletal:        General: Normal range of motion.     Cervical back: Normal range of motion.  Skin:    General: Skin is warm and dry.  Neurological:     Mental Status: She is alert and oriented to person, place, and time.  Psychiatric:        Mood and Affect: Mood normal.        Behavior: Behavior normal.        Thought Content: Thought content normal.    Assessment & Plan:  Brenda Mcintyre was seen today for new patient (initial visit).  Diagnoses and all orders for this visit:  Encounter to establish care  Iron deficiency anemia, unspecified iron deficiency anemia type -     CBC with Differential  Essential hypertension BP goal - < 130/80 Explained that having normal blood pressure is the goal and medications are helping to get to goal and maintain normal blood pressure. DIET: Limit salt intake, read nutrition labels to check salt content,  limit fried and high fatty foods  Avoid using multisymptom OTC cold preparations that generally contain sudafed which can rise BP. Consult with pharmacist on best cold relief products to use for persons with HTN EXERCISE Discussed incorporating exercise such as walking -  30 minutes most days of the week and can do in 10 minute intervals     ASTHMA, EXERCISE INDUCED -     Ambulatory referral to Pulmonology  Vaginal discharge -     Cervicovaginal ancillary only  Malodorous urine -     POCT URINALYSIS DIP (CLINITEK)  Other orders -     losartan (COZAAR) 25 MG tablet; Take 1 tablet (25 mg total) by mouth daily.  Follow-up:  re-check blood pressure   The above assessment and management plan was discussed with the patient. The patient verbalized understanding of and has agreed to the management plan. Patient is aware to call the clinic if symptoms fail to improve or worsen. Patient is aware when to return to the clinic for a follow-up visit. Patient educated on when it is appropriate to go to the emergency department.   Gwinda Passe, NP-C

## 2023-04-26 LAB — CBC WITH DIFFERENTIAL/PLATELET
Basophils Absolute: 0 10*3/uL (ref 0.0–0.2)
Basos: 0 %
EOS (ABSOLUTE): 0.1 10*3/uL (ref 0.0–0.4)
Eos: 2 %
Hematocrit: 33.6 % — ABNORMAL LOW (ref 34.0–46.6)
Hemoglobin: 9.1 g/dL — ABNORMAL LOW (ref 11.1–15.9)
Immature Grans (Abs): 0 10*3/uL (ref 0.0–0.1)
Immature Granulocytes: 0 %
Lymphocytes Absolute: 2.3 10*3/uL (ref 0.7–3.1)
Lymphs: 38 %
MCH: 18.5 pg — ABNORMAL LOW (ref 26.6–33.0)
MCHC: 27.1 g/dL — ABNORMAL LOW (ref 31.5–35.7)
MCV: 68 fL — ABNORMAL LOW (ref 79–97)
Monocytes Absolute: 0.7 10*3/uL (ref 0.1–0.9)
Monocytes: 11 %
Neutrophils Absolute: 2.9 10*3/uL (ref 1.4–7.0)
Neutrophils: 49 %
Platelets: 298 10*3/uL (ref 150–450)
RBC: 4.93 x10E6/uL (ref 3.77–5.28)
RDW: 18.5 % — ABNORMAL HIGH (ref 11.7–15.4)
WBC: 6.1 10*3/uL (ref 3.4–10.8)

## 2023-04-27 ENCOUNTER — Encounter (INDEPENDENT_AMBULATORY_CARE_PROVIDER_SITE_OTHER): Payer: Self-pay | Admitting: Primary Care

## 2023-04-27 LAB — CERVICOVAGINAL ANCILLARY ONLY
Bacterial Vaginitis (gardnerella): NEGATIVE
Candida Glabrata: NEGATIVE
Candida Vaginitis: NEGATIVE
Chlamydia: NEGATIVE
Comment: NEGATIVE
Comment: NEGATIVE
Comment: NEGATIVE
Comment: NEGATIVE
Comment: NEGATIVE
Comment: NORMAL
Neisseria Gonorrhea: NEGATIVE
Trichomonas: NEGATIVE

## 2023-05-23 ENCOUNTER — Encounter (INDEPENDENT_AMBULATORY_CARE_PROVIDER_SITE_OTHER): Payer: Self-pay | Admitting: Primary Care

## 2023-05-23 ENCOUNTER — Ambulatory Visit (INDEPENDENT_AMBULATORY_CARE_PROVIDER_SITE_OTHER): Admitting: Primary Care

## 2023-05-23 VITALS — BP 156/92 | HR 97 | Resp 16 | Wt 259.6 lb

## 2023-05-23 DIAGNOSIS — J4540 Moderate persistent asthma, uncomplicated: Secondary | ICD-10-CM | POA: Diagnosis not present

## 2023-05-23 DIAGNOSIS — I10 Essential (primary) hypertension: Secondary | ICD-10-CM

## 2023-05-23 MED ORDER — FLUTICASONE-SALMETEROL 100-50 MCG/ACT IN AEPB: 1.0000 | INHALATION_SPRAY | Freq: Two times a day (BID) | RESPIRATORY_TRACT | 3 refills | Status: AC

## 2023-05-23 MED ORDER — LOSARTAN POTASSIUM 25 MG PO TABS: 25.0000 mg | ORAL_TABLET | Freq: Every day | ORAL | 1 refills | Status: AC

## 2023-05-23 MED ORDER — ALBUTEROL SULFATE HFA 108 (90 BASE) MCG/ACT IN AERS: 2.0000 | INHALATION_SPRAY | RESPIRATORY_TRACT | 1 refills | Status: AC | PRN

## 2023-05-23 NOTE — Patient Instructions (Signed)
 Hypertension, Adult Hypertension is another name for high blood pressure. High blood pressure forces your heart to work harder to pump blood. This can cause problems over time. There are two numbers in a blood pressure reading. There is a top number (systolic) over a bottom number (diastolic). It is best to have a blood pressure that is below 120/80. What are the causes? The cause of this condition is not known. Some other conditions can lead to high blood pressure. What increases the risk? Some lifestyle factors can make you more likely to develop high blood pressure: Smoking. Not getting enough exercise or physical activity. Being overweight. Having too much fat, sugar, calories, or salt (sodium) in your diet. Drinking too much alcohol. Other risk factors include: Having any of these conditions: Heart disease. Diabetes. High cholesterol. Kidney disease. Obstructive sleep apnea. Having a family history of high blood pressure and high cholesterol. Age. The risk increases with age. Stress. What are the signs or symptoms? High blood pressure may not cause symptoms. Very high blood pressure (hypertensive crisis) may cause: Headache. Fast or uneven heartbeats (palpitations). Shortness of breath. Nosebleed. Vomiting or feeling like you may vomit (nauseous). Changes in how you see. Very bad chest pain. Feeling dizzy. Seizures. How is this treated? This condition is treated by making healthy lifestyle changes, such as: Eating healthy foods. Exercising more. Drinking less alcohol. Your doctor may prescribe medicine if lifestyle changes do not help enough and if: Your top number is above 130. Your bottom number is above 80. Your personal target blood pressure may vary. Follow these instructions at home: Eating and drinking  If told, follow the DASH eating plan. To follow this plan: Fill one half of your plate at each meal with fruits and vegetables. Fill one fourth of your plate  at each meal with whole grains. Whole grains include whole-wheat pasta, brown rice, and whole-grain bread. Eat or drink low-fat dairy products, such as skim milk or low-fat yogurt. Fill one fourth of your plate at each meal with low-fat (lean) proteins. Low-fat proteins include fish, chicken without skin, eggs, beans, and tofu. Avoid fatty meat, cured and processed meat, or chicken with skin. Avoid pre-made or processed food. Limit the amount of salt in your diet to less than 1,500 mg each day. Do not drink alcohol if: Your doctor tells you not to drink. You are pregnant, may be pregnant, or are planning to become pregnant. If you drink alcohol: Limit how much you have to: 0-1 drink a day for women. 0-2 drinks a day for men. Know how much alcohol is in your drink. In the [place] one drink equals one 12 oz bottle of beer (355 mL), one 5 oz glass of wine (148 mL), or one 1 oz glass of hard liquor (44 mL). Lifestyle  Work with your doctor to stay at a healthy weight or to lose weight. Ask your doctor what the best weight is for you. Get at least 30 minutes of exercise that causes your heart to beat faster (aerobic exercise) most days of the week. This may include walking, swimming, or biking. Get at least 30 minutes of exercise that strengthens your muscles (resistance exercise) at least 3 days a week. This may include lifting weights or doing Pilates. Do not smoke or use any products that contain nicotine or tobacco. If you need help quitting, ask your doctor. Check your blood pressure at home as told by your doctor. Keep all follow-up visits. Medicines Take over-the-counter and prescription medicines  only as told by your doctor. Follow directions carefully. Do not skip doses of blood pressure medicine. The medicine does not work as well if you skip doses. Skipping doses also puts you at risk for problems. Ask your doctor about side effects or reactions to medicines that you should watch  for. Contact a doctor if: You think you are having a reaction to the medicine you are taking. You have headaches that keep coming back. You feel dizzy. You have swelling in your ankles. You have trouble with your vision. Get help right away if: You get a very bad headache. You start to feel mixed up (confused). You feel weak or numb. You feel faint. You have very bad pain in your: Chest. Belly (abdomen). You vomit more than once. You have trouble breathing. These symptoms may be an emergency. Get help right away. Call 911. Do not wait to see if the symptoms will go away. Do not drive yourself to the hospital. Summary Hypertension is another name for high blood pressure. High blood pressure forces your heart to work harder to pump blood. For most people, a normal blood pressure is less than 120/80. Making healthy choices can help lower blood pressure. If your blood pressure does not get lower with healthy choices, you may need to take medicine. This information is not intended to replace advice given to you by your health care provider. Make sure you discuss any questions you have with your health care provider. Document Revised: 11/04/2020 Document Reviewed: 11/04/2020 Elsevier Patient Education  2024 [employer].

## 2023-05-23 NOTE — Progress Notes (Signed)
 [facility]  Brenda Mcintyre, is a 48 y.o. female  [id number]  [id number]  DOB - [birth year]  Chief Complaint  Patient presents with   Blood Pressure Check       Subjective:   Brenda Mcintyre is a 48 y.o. female here today for a follow up visit for blood pressure.  Today blood pressure remains elevated she admits she has not picked up her medication that was prescribed on her last visit.  She is also aware of increased risk of stroke and heart attack and has agreed to pick up medication today.  Due to seasonal allergies and pollen asthma has also been a problem refills on medication has been sent.  She also has an appointment with pulmonology next month.  Patient has No headache, No chest pain, No abdominal pain - No Nausea, No new weakness tingling or numbness, No Cough - shortness of breath HPI  No problems updated.  Comprehensive ROS Pertinent positive and negative noted in HPI   Allergies  Allergen Reactions   Sulfa Antibiotics Hives    From childhood    Latex Rash and Other (See Comments)    Skin peels    Past Medical History:  Diagnosis Date   Anemia    Asthma    Hypertension     No current outpatient medications on file prior to visit.   No current facility-administered medications on file prior to visit.   Health Maintenance  Topic Date Due   Hepatitis C Screening  Never done   Pneumococcal Vaccination (1 of 2 - PCV) Never done   Pap with HPV screening  04/04/2020   Colon Cancer Screening  Never done   COVID-19 Vaccine (1 - 2024-25 season) Never done   Flu Shot  08/31/2023   DTaP/Tdap/Td vaccine (3 - Td or Tdap) 09/01/2026   HIV Screening  Completed   HPV Vaccine  Aged Out   Meningitis B Vaccine  Aged Out    Objective:   Vitals:   05/23/23 1510 05/23/23 1518 05/23/23 1531  BP: (!) 168/93 (!) 160/92 (!) 156/92  Pulse: 97    Resp: 16    SpO2: 98%    Weight: 259 lb 9.6 oz (117.8 kg)     BP Readings from Last 3  Encounters:  05/23/23 (!) 156/92  04/25/23 (!) 164/86  02/15/23 (!) 140/95   Physical Exam Vitals reviewed.  Constitutional:      Appearance: Normal appearance. She is obese.     Comments: Morbid obesity   HENT:     Head: Normocephalic.     Right Ear: Tympanic membrane, ear canal and external ear normal.     Left Ear: Tympanic membrane, ear canal and external ear normal.     Nose: Nose normal.     Mouth/Throat:     Mouth: Mucous membranes are moist.  Eyes:     Extraocular Movements: Extraocular movements intact.     Pupils: Pupils are equal, round, and reactive to light.  Cardiovascular:     Rate and Rhythm: Normal rate and regular rhythm.  Pulmonary:     Effort: Pulmonary effort is normal.     Breath sounds: Wheezing present.  Abdominal:     General: Bowel sounds are normal.     Palpations: Abdomen is soft.  Musculoskeletal:        General: Normal range of motion.     Cervical back: Normal range of motion.  Skin:    General: Skin is warm and dry.  Neurological:  Mental Status: She is alert and oriented to person, place, and time.  Psychiatric:        Mood and Affect: Mood normal.        Behavior: Behavior normal.        Thought Content: Thought content normal.    Assessment & Plan  Brenda Mcintyre was seen today for blood pressure check.  Diagnoses and all orders for this visit:  Essential hypertension BP goal - < 130/80 Explained that having normal blood pressure is the goal and medications are helping to get to goal and maintain normal blood pressure. DIET: Limit salt intake, read nutrition labels to check salt content, limit fried and high fatty foods  Avoid using multisymptom OTC cold preparations that generally contain sudafed which can rise BP. Consult with pharmacist on best cold relief products to use for persons with HTN EXERCISE Discussed incorporating exercise such as walking - 30 minutes most days of the week and can do in 10 minute intervals     Moderate  persistent asthma without complication Managed on SABA and LABA  Other orders -     albuterol  (VENTOLIN  HFA) 108 (90 Base) MCG/ACT inhaler; Inhale 2 puffs into the lungs every 4 (four) hours as needed for wheezing or shortness of breath. -     fluticasone -salmeterol (ADVAIR ) 100-50 MCG/ACT AEPB; Inhale 1 puff into the lungs 2 (two) times daily. -     losartan  (COZAAR ) 25 MG tablet; Take 1 tablet (25 mg total) by mouth daily.  Patient have been counseled extensively about nutrition and exercise. Other issues discussed during this visit include: low cholesterol diet, weight control and daily exercise, foot care, annual eye examinations at Ophthalmology, importance of adherence with medications and regular follow-up. We also discussed long term complications of uncontrolled diabetes and hypertension.   Return in about 4 weeks (around 06/20/2023).  The patient was given clear instructions to go to ER or return to medical center if symptoms don't improve, worsen or new problems develop. The patient verbalized understanding. The patient was told to call to get lab results if they haven't heard anything in the next week.   This note has been created with Education officer, environmental. Any transcriptional errors are unintentional.   Marius Siemens, NP 05/26/2023, 3:26 PM

## 2023-06-08 ENCOUNTER — Ambulatory Visit (HOSPITAL_BASED_OUTPATIENT_CLINIC_OR_DEPARTMENT_OTHER): Admitting: Adult Health

## 2023-06-21 ENCOUNTER — Telehealth (INDEPENDENT_AMBULATORY_CARE_PROVIDER_SITE_OTHER): Payer: Self-pay | Admitting: Primary Care

## 2023-06-21 NOTE — Telephone Encounter (Signed)
 Called pt to remind them about upcoming appt. Pt will be present.

## 2023-06-22 ENCOUNTER — Telehealth (INDEPENDENT_AMBULATORY_CARE_PROVIDER_SITE_OTHER): Admitting: Primary Care

## 2023-06-29 ENCOUNTER — Ambulatory Visit: Admitting: Nurse Practitioner

## 2023-07-05 ENCOUNTER — Telehealth (INDEPENDENT_AMBULATORY_CARE_PROVIDER_SITE_OTHER): Payer: Self-pay | Admitting: Primary Care

## 2023-07-05 NOTE — Telephone Encounter (Signed)
 Called pt and LVM about upcoming appt.

## 2023-07-06 ENCOUNTER — Ambulatory Visit (INDEPENDENT_AMBULATORY_CARE_PROVIDER_SITE_OTHER): Admitting: Primary Care

## 2023-07-26 ENCOUNTER — Ambulatory Visit (INDEPENDENT_AMBULATORY_CARE_PROVIDER_SITE_OTHER): Admitting: Primary Care

## 2023-10-29 ENCOUNTER — Ambulatory Visit (INDEPENDENT_AMBULATORY_CARE_PROVIDER_SITE_OTHER): Admitting: Primary Care

## 2023-12-25 ENCOUNTER — Ambulatory Visit (INDEPENDENT_AMBULATORY_CARE_PROVIDER_SITE_OTHER): Admitting: Primary Care
